# Patient Record
Sex: Female | Born: 1970 | Race: Black or African American | Hispanic: No | Marital: Married | Smoking: Never smoker
Health system: Southern US, Community
[De-identification: ages and names within clinical notes are randomized; demographics above are authoritative.]

## PROBLEM LIST (undated history)

## (undated) ENCOUNTER — Inpatient Hospital Stay (HOSPITAL_COMMUNITY): Payer: Self-pay

## (undated) DIAGNOSIS — D259 Leiomyoma of uterus, unspecified: Secondary | ICD-10-CM

## (undated) HISTORY — PX: NASAL POLYP EXCISION: SHX2068

## (undated) HISTORY — PX: WISDOM TOOTH EXTRACTION: SHX21

---

## 2006-01-18 ENCOUNTER — Encounter: Admission: RE | Admit: 2006-01-18 | Discharge: 2006-04-18 | Payer: Self-pay | Admitting: General Practice

## 2006-01-19 ENCOUNTER — Encounter: Admission: RE | Admit: 2006-01-19 | Discharge: 2006-01-19 | Payer: Self-pay | Admitting: General Practice

## 2007-04-05 ENCOUNTER — Ambulatory Visit (HOSPITAL_COMMUNITY): Admission: RE | Admit: 2007-04-05 | Discharge: 2007-04-05 | Payer: Self-pay | Admitting: Obstetrics

## 2007-05-14 ENCOUNTER — Inpatient Hospital Stay (HOSPITAL_COMMUNITY): Admission: AD | Admit: 2007-05-14 | Discharge: 2007-05-16 | Payer: Self-pay | Admitting: Obstetrics & Gynecology

## 2007-06-25 ENCOUNTER — Inpatient Hospital Stay (HOSPITAL_COMMUNITY): Admission: AD | Admit: 2007-06-25 | Discharge: 2007-06-26 | Payer: Self-pay | Admitting: Obstetrics

## 2007-10-09 ENCOUNTER — Inpatient Hospital Stay (HOSPITAL_COMMUNITY): Admission: AD | Admit: 2007-10-09 | Discharge: 2007-10-09 | Payer: Self-pay | Admitting: Obstetrics

## 2007-11-13 ENCOUNTER — Inpatient Hospital Stay (HOSPITAL_COMMUNITY): Admission: AD | Admit: 2007-11-13 | Discharge: 2007-11-13 | Payer: Self-pay | Admitting: Obstetrics

## 2007-11-15 ENCOUNTER — Inpatient Hospital Stay (HOSPITAL_COMMUNITY): Admission: AD | Admit: 2007-11-15 | Discharge: 2007-11-18 | Payer: Self-pay | Admitting: Obstetrics

## 2007-11-15 ENCOUNTER — Ambulatory Visit: Payer: Self-pay | Admitting: Obstetrics & Gynecology

## 2007-12-07 ENCOUNTER — Ambulatory Visit (HOSPITAL_COMMUNITY): Admission: RE | Admit: 2007-12-07 | Discharge: 2007-12-07 | Payer: Self-pay | Admitting: Obstetrics & Gynecology

## 2009-12-31 ENCOUNTER — Emergency Department (HOSPITAL_COMMUNITY): Admission: EM | Admit: 2009-12-31 | Discharge: 2009-12-31 | Payer: Self-pay | Admitting: Emergency Medicine

## 2010-05-17 ENCOUNTER — Ambulatory Visit (HOSPITAL_COMMUNITY): Admission: RE | Admit: 2010-05-17 | Discharge: 2010-05-17 | Payer: Self-pay | Admitting: Obstetrics & Gynecology

## 2010-08-17 ENCOUNTER — Inpatient Hospital Stay (HOSPITAL_COMMUNITY)
Admission: AD | Admit: 2010-08-17 | Discharge: 2010-08-17 | Payer: Self-pay | Source: Home / Self Care | Admitting: Obstetrics & Gynecology

## 2010-10-10 ENCOUNTER — Inpatient Hospital Stay (HOSPITAL_COMMUNITY)
Admission: AD | Admit: 2010-10-10 | Discharge: 2010-10-10 | Payer: Self-pay | Source: Home / Self Care | Attending: Obstetrics & Gynecology | Admitting: Obstetrics & Gynecology

## 2010-10-11 ENCOUNTER — Inpatient Hospital Stay (HOSPITAL_COMMUNITY)
Admission: AD | Admit: 2010-10-11 | Discharge: 2010-10-13 | Payer: Self-pay | Source: Home / Self Care | Attending: Obstetrics & Gynecology | Admitting: Obstetrics & Gynecology

## 2010-10-13 LAB — CBC
HCT: 37.6 % (ref 36.0–46.0)
HCT: 32.8 % — ABNORMAL LOW (ref 36.0–46.0)
Hemoglobin: 12.9 g/dL (ref 12.0–15.0)
Hemoglobin: 11 g/dL — ABNORMAL LOW (ref 12.0–15.0)
MCH: 30.7 pg (ref 26.0–34.0)
MCH: 30.1 pg (ref 26.0–34.0)
MCHC: 34.3 g/dL (ref 30.0–36.0)
MCHC: 33.5 g/dL (ref 30.0–36.0)
MCV: 89.5 fL (ref 78.0–100.0)
MCV: 89.6 fL (ref 78.0–100.0)
Platelets: 119 10*3/uL — ABNORMAL LOW (ref 150–400)
Platelets: 135 10*3/uL — ABNORMAL LOW (ref 150–400)
RBC: 4.2 MIL/uL (ref 3.87–5.11)
RBC: 3.66 MIL/uL — ABNORMAL LOW (ref 3.87–5.11)
RDW: 14.6 % (ref 11.5–15.5)
RDW: 14.6 % (ref 11.5–15.5)
WBC: 6.4 10*3/uL (ref 4.0–10.5)
WBC: 7.7 10*3/uL (ref 4.0–10.5)

## 2010-10-13 LAB — RPR: RPR Ser Ql: NONREACTIVE

## 2010-10-17 ENCOUNTER — Encounter: Payer: Self-pay | Admitting: Obstetrics & Gynecology

## 2011-02-08 NOTE — Op Note (Signed)
NAMELETESHA, Amy Santana         ACCOUNT NO.:  [id number]   MEDICAL RECORD NO.:  [id number]          PATIENT TYPE:  AMB   LOCATION:  SDC                           FACILITY:  [facility]   PHYSICIAN:  Roseanna Rainbow, M.D.DATE OF BIRTH:  07/30/1971   DATE OF PROCEDURE:  12/07/2007  DATE OF DISCHARGE:                               OPERATIVE REPORT   PREOPERATIVE DIAGNOSIS:  Left-sided Bartholin's gland cyst.   POSTOPERATIVE DIAGNOSIS:  Left-sided Bartholin's gland abscess,  hematoma.   PROCEDURE:  Marsupialization of the left Bartholin's gland.   SURGEON:  Jackson-Moore.   ANESTHESIA:  Managed anesthesia care, local.   FINDINGS:  Enlarged left Bartholin's gland with purulent material admix  with old blood and clot as the contents of the gland.   ESTIMATED BLOOD LOSS:  Minimal.   COMPLICATIONS:  None.   IV FLUIDS AND URINE OUTPUT:  As per anesthesiology.   PROCEDURE:  The patient was taken to the operating room with an IV  running.  She was placed in the dorsal lithotomy position and prepped  and draped in the usual sterile fashion.  An exam revealed the above  findings.  The Bartholin's gland was enlarged to approximately 2-4 cm in  diameter.  Labial stitches were then placed to facilitate exposure.  The  mucocutaneous junction on the left side was then infiltrated with 1%  Xylocaine.  An elliptical incision was made over the Bartholin's gland  with a scalpel.  This skin was then excised.  The gland was then opened  with the scalpel.  This incision was then extended with scissors.  The  contents of the gland were then cultured.  The gland was then irrigated.  The bleeding points in the cyst wall base were cauterized with the  Bovie.  The cyst wall was then plicated to the vaginal mucosa using  interrupted sutures of 2-0 Vicryl.  The sutures were placed  circumferentially.  The gland was then irrigated.  Adequate hemostasis  was noted.  The gland was then packed with Iodoform  gauze.  The  retraction sutures in the labia were then cut.  At the close of the  procedure, the instrument and pack counts were said to be correct x2.  The patient was taken to the PACU awake and in stable condition.      Roseanna Rainbow, M.D.  Electronically Signed    LAJ/MEDQ  D:  12/07/2007  T:  12/09/2007  Job:  [id number]

## 2011-02-08 NOTE — H&P (Signed)
Amy Santana, Amy Santana         ACCOUNT NO.:  [id number]   MEDICAL RECORD NO.:  [id number]          PATIENT TYPE:  OBV   LOCATION:  9315                          FACILITY:  [facility]   PHYSICIAN:  Roseanna Rainbow, M.D.DATE OF BIRTH:  09-28-1970   DATE OF ADMISSION:  05/14/2007  DATE OF DISCHARGE:                              HISTORY & PHYSICAL   CHIEF COMPLAINT:  The patient is a 40 year old, para 0, with an  estimated date of confinement of November 17, 2007, with an intrauterine  pregnancy at 13 plus weeks complaining of lower abdominal pain.   HISTORY OF PRESENT ILLNESS:  The patient has multiple subserosal uterine  fibroids.  There is an anterior fibroid, a left fibroid as well as a  right fundal fibroid.  These are approximately 2-5 cm in size.  The  patient reports onset of pain several day prior to presentation.  The  pain is characterized as both sharp and crampy.  It is constant.  She  localizes the pain to her left lower quadrant and near the umbilicus.  She denies any associated symptoms.   ALLERGIES:  No known drug allergies.   MEDICATIONS:  Please see the medication reconciliation form.   OB RISK FACTORS:  Please see the above, advanced maternal age.   PRENATAL SCREENING:  Rubella immune, RPR nonreactive.  Platelets  199,000.  HIV nonreactive.  Hemoglobin 12.1, hematocrit 37.7.  Hepatis B  surface antigen negative.  GC probe negative.  Chlamydia probe negative.  Urine culture and sensitivity:  Insignificant growth.  Blood type B  positive.  Antibody screen negative.   PAST GYN HISTORY:  Noncontributory.   PAST MEDICAL HISTORY:  She denies.   PAST SURGICAL HISTORY:  Nasal polyp removed.   SOCIAL HISTORY:  She is married, living with her spouse.  Does not give  any significant history of alcohol usage, has no significant smoking  history.   FAMILY HISTORY:  No major illnesses known.   PHYSICAL EXAMINATION:  VITAL SIGNS:  Temperature 98,  blood pressure  101/74, pulse 101, respirations 20.  GENERAL:  No apparent distress.  ABDOMEN:  There is tenderness noted anteriorly in the periumbilical  region as well as the left lower quadrant.  There is no rebound or  guarding.  PELVIC:  Exam deferred.   LABORATORY DATA:  LDH normal. CMET normal. CBC:  White blood cell count  7.7, hemoglobin 12.8,  platelets 188,000.  Urinalysis negative.   ASSESSMENT:  Early pregnancy complicated by uterine fibroids, rule out  degeneration.   PLAN:  1. Observation.  2. IV hydration.  3. Indomethacin.      Roseanna Rainbow, M.D.  Electronically Signed     LAJ/MEDQ  D:  05/14/2007  T:  05/14/2007  Job:  [id number]

## 2011-05-23 ENCOUNTER — Inpatient Hospital Stay (INDEPENDENT_AMBULATORY_CARE_PROVIDER_SITE_OTHER)
Admission: RE | Admit: 2011-05-23 | Discharge: 2011-05-23 | Disposition: A | Discharge status: Home / Self Care | Payer: Medicaid Other | Source: Ambulatory Visit | Attending: Emergency Medicine | Admitting: Emergency Medicine

## 2011-05-23 DIAGNOSIS — J3489 Other specified disorders of nose and nasal sinuses: Secondary | ICD-10-CM

## 2011-05-23 DIAGNOSIS — J310 Chronic rhinitis: Secondary | ICD-10-CM

## 2011-05-23 DIAGNOSIS — J029 Acute pharyngitis, unspecified: Secondary | ICD-10-CM

## 2011-06-16 LAB — URINALYSIS, ROUTINE W REFLEX MICROSCOPIC
Bilirubin Urine: NEGATIVE
Glucose, UA: NEGATIVE
Hgb urine dipstick: NEGATIVE
Ketones, ur: NEGATIVE
Nitrite: NEGATIVE
Protein, ur: NEGATIVE
Specific Gravity, Urine: 1.01
Urobilinogen, UA: 0.2
pH: 7.5

## 2011-06-17 ENCOUNTER — Other Ambulatory Visit (INDEPENDENT_AMBULATORY_CARE_PROVIDER_SITE_OTHER): Payer: Self-pay | Admitting: Otolaryngology

## 2011-06-17 LAB — CBC
HCT: 32 — ABNORMAL LOW
HCT: 37.4
Hemoglobin: 11.1 — ABNORMAL LOW
Hemoglobin: 13.1
MCHC: 34.8
MCHC: 35.1
MCV: 89.8
MCV: 90.1
Platelets: 151
Platelets: 164
RBC: 3.56 — ABNORMAL LOW
RBC: 4.17
RDW: 14.1
RDW: 14.7
WBC: 11.3 — ABNORMAL HIGH
WBC: 7.1

## 2011-06-17 LAB — RPR: RPR Ser Ql: NONREACTIVE

## 2011-06-20 LAB — WOUND CULTURE: Culture: NO GROWTH

## 2011-06-20 LAB — CBC
HCT: 36.2
Hemoglobin: 12.4
MCHC: 34.4
MCV: 88.3
Platelets: 272
RBC: 4.09
RDW: 13.6
WBC: 7.4

## 2011-06-20 LAB — ANAEROBIC CULTURE

## 2011-06-24 ENCOUNTER — Ambulatory Visit
Admission: RE | Admit: 2011-06-24 | Discharge: 2011-06-24 | Disposition: A | Discharge status: Home / Self Care | Payer: Medicaid Other | Source: Ambulatory Visit | Attending: Otolaryngology | Admitting: Otolaryngology

## 2011-07-07 LAB — URINALYSIS, ROUTINE W REFLEX MICROSCOPIC
Bilirubin Urine: NEGATIVE
Glucose, UA: NEGATIVE
Hgb urine dipstick: NEGATIVE
Ketones, ur: NEGATIVE
Nitrite: NEGATIVE
Protein, ur: NEGATIVE
Specific Gravity, Urine: 1.01
Urobilinogen, UA: 0.2
pH: 7.5

## 2011-07-08 LAB — COMPREHENSIVE METABOLIC PANEL
ALT: 22
AST: 25
Albumin: 3 — ABNORMAL LOW
Alkaline Phosphatase: 61
BUN: 2 — ABNORMAL LOW
CO2: 25
Calcium: 9.2
Chloride: 105
Creatinine, Ser: 0.56
GFR calc Af Amer: >60
GFR calc non Af Amer: >60
Glucose, Bld: 74
Potassium: 3.5
Sodium: 136
Total Bilirubin: 0.4
Total Protein: 6.8

## 2011-07-08 LAB — URINALYSIS, ROUTINE W REFLEX MICROSCOPIC
Bilirubin Urine: NEGATIVE
Glucose, UA: NEGATIVE
Hgb urine dipstick: NEGATIVE
Ketones, ur: NEGATIVE
Nitrite: NEGATIVE
Protein, ur: NEGATIVE
Specific Gravity, Urine: <1.005 — ABNORMAL LOW
Urobilinogen, UA: 0.2
pH: 5.5

## 2011-07-08 LAB — CBC
HCT: 35.9 — ABNORMAL LOW
Hemoglobin: 12.8
MCHC: 35.6
MCV: 85.5
Platelets: 188
RBC: 4.2
RDW: 16.4 — ABNORMAL HIGH
WBC: 7.7

## 2011-07-08 LAB — LACTATE DEHYDROGENASE: LDH: 146

## 2011-07-20 ENCOUNTER — Encounter (HOSPITAL_COMMUNITY): Payer: Self-pay | Admitting: *Deleted

## 2011-07-20 ENCOUNTER — Inpatient Hospital Stay (HOSPITAL_COMMUNITY)
Admission: AD | Admit: 2011-07-20 | Discharge: 2011-07-20 | Disposition: A | Discharge status: Home / Self Care | Payer: Medicaid Other | Source: Ambulatory Visit | Attending: Obstetrics & Gynecology | Admitting: Obstetrics & Gynecology

## 2011-07-20 DIAGNOSIS — N644 Mastodynia: Secondary | ICD-10-CM

## 2011-07-20 NOTE — ED Provider Notes (Signed)
History   Pt presents today c/o pain in her Rt breast. She states her child is 73mo old and recently bit her breast. Since that time she developed a small "ulcer" on her nipple and states it is painful to breastfeed. 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 Senaida Ores tomorrow. She denies fever, redness, or any other sx. She continues to breastfeed without difficulty.  Chief Complaint  Patient presents with  . Breast Pain   HPI  OB History    Grav Para Term Preterm Abortions TAB SAB Ect Mult Living   2 2 2  0 0 0 0 0 2       Past Medical History  Diagnosis Date  . No pertinent past medical history     Past Surgical History  Procedure Date  . Nasal polyp excision     Family History  Problem Relation Age of Onset  . Hypertension Mother     History  Substance Use Topics  . Smoking status: Never Smoker   . Smokeless tobacco: Not on file  . Alcohol Use: No    Allergies: Allergies not on file  No prescriptions prior to admission    Review of Systems  Constitutional: Negative for fever and chills.  Eyes: Negative for blurred vision.  Cardiovascular: Negative for chest pain.  Gastrointestinal: Negative for nausea, vomiting, abdominal pain, diarrhea and constipation.  Genitourinary: Negative for dysuria, urgency, frequency and hematuria.  Skin: Negative for rash.  Neurological: Negative for dizziness and headaches.  Psychiatric/Behavioral: Negative for depression and suicidal ideas.   Physical Exam   Blood pressure 118/84, pulse 77, temperature 98.9 F (37.2 C), resp. rate 16, height 5\' 1"  (1.549 m), weight 148 lb (67.132 kg), last menstrual period 06/20/2011, SpO2 99.00%.  Physical Exam  Nursing note and vitals reviewed. Constitutional: She is oriented to person, place, and time. She appears well-developed and well-nourished. No distress.  HENT:  Head: Normocephalic and atraumatic.  Eyes: EOM are normal. Pupils are equal, round, and reactive to  light.  GI: Soft. She exhibits no distension. There is no tenderness. There is no rebound and no guarding.  Genitourinary:       Breasts are symmetrical. She has a small 0.5cm superficial cyst just superior to the Rt areola. It is easily mobile and slightly tender to palpation. There is no obvious lesion to her nipple. No erythema, edema, or purulent drainage.  Neurological: She is alert and oriented to person, place, and time.  Skin: Skin is warm and dry. She is not diaphoretic.  Psychiatric: She has a normal mood and affect. Her behavior is normal. Judgment and thought content normal.    MAU Course  Procedures    Assessment and Plan  Mastodynia: discussed with pt at length. She most likely has a blocked milk gland. Advised warm compresses and told her to keep her scheduled appt tomorrow. Discussed diet, activity, risks, and precautions.  Clinton Gallant. Rice III, DrHSc, MPAS, PA-C  07/20/2011, 7:54 PM   Estelle Hoover, PA 07/20/11 2001

## 2011-07-20 NOTE — Progress Notes (Signed)
Pt reports she is breastfeeding her 28 month old son and has noticed a reddened area on her rt breast, also noted a blister like area on nipple. States it is painful to breastfeed. Denies fever.

## 2011-07-30 ENCOUNTER — Ambulatory Visit (INDEPENDENT_AMBULATORY_CARE_PROVIDER_SITE_OTHER): Payer: Medicaid Other

## 2011-07-30 ENCOUNTER — Inpatient Hospital Stay (INDEPENDENT_AMBULATORY_CARE_PROVIDER_SITE_OTHER)
Admission: RE | Admit: 2011-07-30 | Discharge: 2011-07-30 | Disposition: A | Discharge status: Home / Self Care | Payer: Medicaid Other | Source: Ambulatory Visit | Attending: Family Medicine | Admitting: Family Medicine

## 2011-07-30 DIAGNOSIS — B9789 Other viral agents as the cause of diseases classified elsewhere: Secondary | ICD-10-CM

## 2012-03-27 ENCOUNTER — Encounter (HOSPITAL_COMMUNITY): Payer: Self-pay | Admitting: Emergency Medicine

## 2012-03-27 ENCOUNTER — Emergency Department (INDEPENDENT_AMBULATORY_CARE_PROVIDER_SITE_OTHER)
Admission: EM | Admit: 2012-03-27 | Discharge: 2012-03-27 | Disposition: A | Discharge status: Home / Self Care | Payer: Medicaid Other | Source: Home / Self Care | Attending: Emergency Medicine | Admitting: Emergency Medicine

## 2012-03-27 DIAGNOSIS — E876 Hypokalemia: Secondary | ICD-10-CM

## 2012-03-27 DIAGNOSIS — I951 Orthostatic hypotension: Secondary | ICD-10-CM

## 2012-03-27 LAB — POCT PREGNANCY, URINE: Preg Test, Ur: NEGATIVE

## 2012-03-27 LAB — COMPREHENSIVE METABOLIC PANEL
ALT: 19 U/L (ref 0–35)
AST: 23 U/L (ref 0–37)
Alkaline Phosphatase: 64 U/L (ref 39–117)
CO2: 25 mEq/L (ref 19–32)
Calcium: 8.9 mg/dL (ref 8.4–10.5)
Chloride: 104 mEq/L (ref 96–112)
GFR calc Af Amer: >90 mL/min (ref >90)
GFR calc non Af Amer: >90 mL/min (ref >90)
Glucose, Bld: 87 mg/dL (ref 70–99)
Potassium: 3.3 mEq/L — ABNORMAL LOW (ref 3.5–5.1)
Sodium: 136 mEq/L (ref 135–145)
Total Bilirubin: 0.2 mg/dL — ABNORMAL LOW (ref 0.3–1.2)

## 2012-03-27 LAB — CBC WITH DIFFERENTIAL/PLATELET
Basophils Absolute: 0 10*3/uL (ref 0.0–0.1)
Eosinophils Relative: 2 % (ref 0–5)
Lymphocytes Relative: 31 % (ref 12–46)
Lymphs Abs: 1.4 10*3/uL (ref 0.7–4.0)
MCV: 87.6 fL (ref 78.0–100.0)
Neutro Abs: 2.8 10*3/uL (ref 1.7–7.7)
Platelets: 151 10*3/uL (ref 150–400)
RBC: 4.21 MIL/uL (ref 3.87–5.11)
RDW: 13.1 % (ref 11.5–15.5)
WBC: 4.6 10*3/uL (ref 4.0–10.5)

## 2012-03-27 LAB — POCT URINALYSIS DIP (DEVICE)
Bilirubin Urine: NEGATIVE
Leukocytes, UA: NEGATIVE
Nitrite: NEGATIVE
Protein, ur: NEGATIVE mg/dL
Urobilinogen, UA: 0.2 mg/dL (ref 0.0–1.0)
pH: 5.5 (ref 5.0–8.0)

## 2012-03-27 MED ORDER — POTASSIUM CHLORIDE CRYS ER 20 MEQ PO TBCR
EXTENDED_RELEASE_TABLET | ORAL | Status: AC
  Filled 2012-03-27: qty 1

## 2012-03-27 MED ORDER — POTASSIUM CHLORIDE ER 10 MEQ PO TBCR: 10.0000 meq | EXTENDED_RELEASE_TABLET | Freq: Two times a day (BID) | ORAL | Status: DC

## 2012-03-27 MED ORDER — POTASSIUM CHLORIDE CRYS ER 20 MEQ PO TBCR
20.0000 meq | EXTENDED_RELEASE_TABLET | Freq: Once | ORAL | Status: AC
  Administered 2012-03-27: 20 meq via ORAL

## 2012-03-27 NOTE — ED Notes (Signed)
Patient sipping on ginger ale

## 2012-03-27 NOTE — ED Notes (Signed)
Reports with last period she lost a lot of blood.  Patient also reports she feels like she did with her pregnancies.

## 2012-03-27 NOTE — ED Notes (Signed)
Patient getting urine specimen.  Equipment at bedside, instructed to place on gown for physician exam

## 2012-03-27 NOTE — ED Notes (Signed)
C/o feeling dizzy and weak.  Onset last week per patient.  Denies cough, no cold, no diarrhea, no vomiting.  Reports no fever, but shivering in the evenings, reports no appetite.  Reports water/foods do not taste normal-taste "bitter" per patient.

## 2012-03-27 NOTE — ED Provider Notes (Signed)
Chief Complaint  Patient presents with  . Dizziness    History of Present Illness:   Amy Santana is a 41 year old female who has had a one-week history of dizziness. She describes this as a sensation that she's about to faint. She denies any vertigo. The dizziness is worse if she stands up or sits up and better if she lies flat. She's felt somewhat feverish, chilled, had some sweats. She denies any rhinorrhea, or cough. She's had no nausea, vomiting, or diarrhea. She doesn't have much of an appetite and thinks she may have lost weight. She states that she is unable to drink water because it has a bitter taste. She feels weak all over, tired, and rundown. She's had a slight sore throat and nasal congestion. She has had no headache, adenopathy, coughing, wheezing, shortness of breath, chest pain, or palpitations. She denies any abdominal pain or urinary or GYN complaints. She's had no extremity pain, paresthesias, swelling, or skin rash.  Review of Systems:  Other than noted above, the patient denies any of the following symptoms. Systemic:  No fever, chills, sweats, fatigue, myalgias, headache, or anorexia. Eye:  No redness, pain or drainage. ENT:  No earache, nasal congestion, rhinorrhea, sinus pressure, or sore throat. Lungs:  No cough, sputum production, wheezing, shortness of breath.  Cardiovascular:  No chest pain, palpitations, or syncope. GI:  No nausea, vomiting, abdominal pain or diarrhea. GU:  No dysuria, frequency, or hematuria. Skin:  No rash or pruritis.  PMFSH:  Past medical history, family history, social history, meds, and allergies were reviewed.  Physical Exam:   Vital signs:  BP 101/65  Pulse 90  Temp 97.9 F (36.6 C) (Oral)  Resp 16  LMP 03/23/2012 Filed Vitals:   03/27/12 1225 Supine  03/27/12 1228 Sitting  03/27/12 1230 Standing  03/27/12 1231  BP: 120/76 107/66 101/65   Pulse: 63 75 90   Temp:    97.9 F (36.6 C)  TempSrc:    Oral  Resp:    16    General:   Alert, in no distress. Eye:  PERRL, full EOMs.  Lids and conjunctivas were normal. ENT:  TMs and canals were normal, without erythema or inflammation.  Nasal mucosa was clear and uncongested, without drainage.  Mucous membranes were moist.  Pharynx was clear, without exudate or drainage.  There were no oral ulcerations or lesions. Neck:  Supple, no adenopathy, tenderness or mass. Thyroid was normal. Lungs:  No respiratory distress.  Lungs were clear to auscultation, without wheezes, rales or rhonchi.  Breath sounds were clear and equal bilaterally. Heart:  Regular rhythm, without gallops, murmers or rubs. Abdomen:  Soft, flat, and non-tender to palpation.  No hepatosplenomagaly or mass. Skin:  Clear, warm, and dry, without rash or lesions.  Labs:   Results for orders placed during the hospital encounter of 03/27/12  POCT PREGNANCY, URINE      Component Value Range   Preg Test, Ur NEGATIVE  NEGATIVE  CBC WITH DIFFERENTIAL      Component Value Range   WBC 4.6  4.0 - 10.5 K/uL   RBC 4.21  3.87 - 5.11 MIL/uL   Hemoglobin 12.6  12.0 - 15.0 g/dL   HCT 56.2  13.0 - 86.5 %   MCV 87.6  78.0 - 100.0 fL   MCH 29.9  26.0 - 34.0 pg   MCHC 34.1  30.0 - 36.0 g/dL   RDW 78.4  69.6 - 29.5 %   Platelets 151  150 - 400  K/uL   Neutrophils Relative 61  43 - 77 %   Neutro Abs 2.8  1.7 - 7.7 K/uL   Lymphocytes Relative 31  12 - 46 %   Lymphs Abs 1.4  0.7 - 4.0 K/uL   Monocytes Relative 6  3 - 12 %   Monocytes Absolute 0.3  0.1 - 1.0 K/uL   Eosinophils Relative 2  0 - 5 %   Eosinophils Absolute 0.1  0.0 - 0.7 K/uL   Basophils Relative 0  0 - 1 %   Basophils Absolute 0.0  0.0 - 0.1 K/uL  COMPREHENSIVE METABOLIC PANEL      Component Value Range   Sodium 136  135 - 145 mEq/L   Potassium 3.3 (*) 3.5 - 5.1 mEq/L   Chloride 104  96 - 112 mEq/L   CO2 25  19 - 32 mEq/L   Glucose, Bld 87  70 - 99 mg/dL   BUN 10  6 - 23 mg/dL   Creatinine, Ser 1.61  0.50 - 1.10 mg/dL   Calcium 8.9  8.4 - 09.6 mg/dL    Total Protein 7.2  6.0 - 8.3 g/dL   Albumin 3.6  3.5 - 5.2 g/dL   AST 23  0 - 37 U/L   ALT 19  0 - 35 U/L   Alkaline Phosphatase 64  39 - 117 U/L   Total Bilirubin 0.2 (*) 0.3 - 1.2 mg/dL   GFR calc non Af Amer >90  >90 mL/min   GFR calc Af Amer >90  >90 mL/min  POCT URINALYSIS DIP (DEVICE)      Component Value Range   Glucose, UA NEGATIVE  NEGATIVE mg/dL   Bilirubin Urine NEGATIVE  NEGATIVE   Ketones, ur NEGATIVE  NEGATIVE mg/dL   Specific Gravity, Urine 1.020  1.005 - 1.030   Hgb urine dipstick MODERATE (*) NEGATIVE   pH 5.5  5.0 - 8.0   Protein, ur NEGATIVE  NEGATIVE mg/dL   Urobilinogen, UA 0.2  0.0 - 1.0 mg/dL   Nitrite NEGATIVE  NEGATIVE   Leukocytes, UA NEGATIVE  NEGATIVE    Course in Urgent Care Center:  She was given potassium chloride extended release 20 mEq by mouth and tolerated this well.   Assessment:  The primary encounter diagnosis was Postural hypotension. A diagnosis of Hypokalemia was also pertinent to this visit. She appeared to have mild hypokalemia and also to be mildly dehydrated. I encouraged her to try get extra oral fluids and potassium-containing foods. I suggested she return if no better in 3 or 4 days.  Plan:   1.  The following meds were prescribed:   New Prescriptions   POTASSIUM CHLORIDE (K-DUR) 10 MEQ TABLET    Take 1 tablet (10 mEq total) by mouth 2 (two) times daily.   2.  The patient was instructed in symptomatic care and handouts were given. 3.  The patient was told to return if becoming worse in any way, if no better in 3 or 4 days, and given some red flag symptoms that would indicate earlier return.    Reuben Likes, MD 03/27/12 [phone]

## 2012-05-14 ENCOUNTER — Ambulatory Visit: Payer: Self-pay | Admitting: Family Medicine

## 2012-07-20 ENCOUNTER — Encounter (HOSPITAL_COMMUNITY): Payer: Self-pay | Admitting: *Deleted

## 2012-07-20 ENCOUNTER — Emergency Department (HOSPITAL_COMMUNITY)
Admission: EM | Admit: 2012-07-20 | Discharge: 2012-07-20 | Disposition: A | Discharge status: Home / Self Care | Payer: Medicaid Other | Source: Home / Self Care | Attending: Emergency Medicine | Admitting: Emergency Medicine

## 2012-07-20 DIAGNOSIS — L219 Seborrheic dermatitis, unspecified: Secondary | ICD-10-CM

## 2012-07-20 DIAGNOSIS — L218 Other seborrheic dermatitis: Secondary | ICD-10-CM

## 2012-07-20 MED ORDER — KETOCONAZOLE 2 % EX SHAM: MEDICATED_SHAMPOO | CUTANEOUS | Status: DC

## 2012-07-20 NOTE — ED Notes (Signed)
Pt called  In lobby x 1

## 2012-07-20 NOTE — ED Provider Notes (Signed)
History     CSN: [id number]  Arrival date & time 07/20/12  1255   First MD Initiated Contact with Patient 07/20/12 1551      Chief Complaint  Patient presents with  . Rash    (Consider location/radiation/quality/duration/timing/severity/associated sxs/prior treatment) Patient is a 41 y.o. female presenting with rash. The history is provided by the patient.  Rash  This is a recurrent problem. The current episode started more than 1 week ago (6 months ago). Progression since onset: waxing and waning. The problem is associated with nothing. There has been no fever. The rash is present on the scalp. The pain is at a severity of 0/10. The patient is experiencing no pain. Associated symptoms include blisters, itching and weeping. Treatments tried: selsium blue shampoo. The treatment provided mild relief. Risk factors: no known risk factors.    Past Medical History  Diagnosis Date  . No pertinent past medical history     Past Surgical History  Procedure Date  . Nasal polyp excision     Family History  Problem Relation Age of Onset  . Hypertension Mother     History  Substance Use Topics  . Smoking status: Never Smoker   . Smokeless tobacco: Not on file  . Alcohol Use: No    OB History    Grav Para Term Preterm Abortions TAB SAB Ect Mult Living   2 2 2  0 0 0 0 0 2       Review of Systems  Constitutional: Negative.   HENT: Negative.   Respiratory: Negative.   Cardiovascular: Negative.   Skin: Positive for itching and rash.    Allergies  Review of patient's allergies indicates no known allergies.  Home Medications   Current Outpatient Rx  Name Route Sig Dispense Refill  . KETOCONAZOLE 2 % EX SHAM Topical Apply topically 2 (two) times a week. 120 mL 11  . POTASSIUM CHLORIDE ER 10 MEQ PO TBCR Oral Take 1 tablet (10 mEq total) by mouth 2 (two) times daily. 10 tablet 0    BP 110/72  Pulse 71  Temp 98.8 F (37.1 C) (Oral)  Resp 20  SpO2 100%  LMP  06/22/2012  Physical Exam  Nursing note and vitals reviewed. Constitutional: She is oriented to person, place, and time. Vital signs are normal. She appears well-developed and well-nourished. She is active and cooperative.  HENT:  Head: Normocephalic.       Scalp rash-white flaky, seborrhea  Eyes: Conjunctivae normal are normal. Pupils are equal, round, and reactive to light. No scleral icterus.  Neck: Trachea normal. Neck supple.  Cardiovascular: Normal rate and regular rhythm.   Pulmonary/Chest: Effort normal and breath sounds normal.  Neurological: She is alert and oriented to person, place, and time. No cranial nerve deficit or sensory deficit.  Skin: Skin is warm and dry.  Psychiatric: She has a normal mood and affect. Her speech is normal and behavior is normal. Judgment and thought content normal. Cognition and memory are normal.    ED Course  Procedures (including critical care time)  Labs Reviewed - No data to display No results found.   1. Seborrheic dermatitis of scalp       MDM  First wash selsium blue, 2nd wash ketoconozole, 3rd wash shampoo of your choice.  Use hair oil instead of pomades. F/u as needed.       Johnsie Kindred, NP 07/22/12 1843

## 2012-07-20 NOTE — ED Notes (Signed)
Pt reports rash and sores in scalp for the past 6 months

## 2012-07-23 NOTE — ED Provider Notes (Signed)
Medical screening examination/treatment/procedure(s) were performed by non-physician practitioner and as supervising physician I was immediately available for consultation/collaboration.  Leslee Home, M.D.   Reuben Likes, MD 07/23/12 [phone]

## 2012-08-10 ENCOUNTER — Encounter (HOSPITAL_COMMUNITY): Payer: Self-pay | Admitting: *Deleted

## 2012-08-10 ENCOUNTER — Emergency Department (INDEPENDENT_AMBULATORY_CARE_PROVIDER_SITE_OTHER)
Admission: EM | Admit: 2012-08-10 | Discharge: 2012-08-10 | Disposition: A | Discharge status: Home / Self Care | Payer: Medicaid Other | Source: Home / Self Care | Attending: Emergency Medicine | Admitting: Emergency Medicine

## 2012-08-10 DIAGNOSIS — S43499A Other sprain of unspecified shoulder joint, initial encounter: Secondary | ICD-10-CM

## 2012-08-10 DIAGNOSIS — S46811A Strain of other muscles, fascia and tendons at shoulder and upper arm level, right arm, initial encounter: Secondary | ICD-10-CM

## 2012-08-10 MED ORDER — CYCLOBENZAPRINE HCL 10 MG PO TABS: ORAL_TABLET | ORAL | Status: DC

## 2012-08-10 NOTE — ED Notes (Signed)
Pt continuing to await MD assessment

## 2012-08-10 NOTE — ED Provider Notes (Signed)
Medical screening examination/treatment/procedure(s) were performed by non-physician practitioner and as supervising physician I was immediately available for consultation/collaboration.  Kalyse Meharg, M.D.   Haward Pope C Sybol Morre, MD 08/10/12 2111 

## 2012-08-10 NOTE — ED Provider Notes (Signed)
History     CSN: [id number]  Arrival date & time 08/10/12  1226   First MD Initiated Contact with Patient 08/10/12 1359      Chief Complaint  Patient presents with  . Torticollis    (Consider location/radiation/quality/duration/timing/severity/associated sxs/prior treatment) HPI Comments: Pt states she fell asleep while sitting on her couch ~ 1 week ago.  Starting yesterday she has had pain in the R trapezius distribution.   Hurts to rotate neck.  No known injury.  No fever or chills.  No PCP  The history is provided by the patient. No language interpreter was used.    Past Medical History  Diagnosis Date  . No pertinent past medical history     Past Surgical History  Procedure Date  . Nasal polyp excision     Family History  Problem Relation Age of Onset  . Hypertension Mother     History  Substance Use Topics  . Smoking status: Never Smoker   . Smokeless tobacco: Not on file  . Alcohol Use: No    OB History    Grav Para Term Preterm Abortions TAB SAB Ect Mult Living   2 2 2  0 0 0 0 0 2       Review of Systems  Constitutional: Negative for fever and chills.  HENT: Negative for neck pain.   Neurological: Negative for weakness and numbness.  All other systems reviewed and are negative.    Allergies  Review of patient's allergies indicates no known allergies.  Home Medications   Current Outpatient Rx  Name  Route  Sig  Dispense  Refill  . CYCLOBENZAPRINE HCL 10 MG PO TABS      1/2 to one po TID   20 tablet   0   . KETOCONAZOLE 2 % EX SHAM   Topical   Apply topically 2 (two) times a week.   120 mL   11   . POTASSIUM CHLORIDE ER 10 MEQ PO TBCR   Oral   Take 1 tablet (10 mEq total) by mouth 2 (two) times daily.   10 tablet   0     BP 118/89  Pulse 71  Temp 98.6 F (37 C) (Oral)  Resp 16  SpO2 99%  LMP 07/22/2012  Physical Exam  Nursing note and vitals reviewed. Constitutional: She is oriented to person, place, and time. She  appears well-developed and well-nourished. No distress.  HENT:  Head: Normocephalic and atraumatic.  Eyes: EOM are normal.  Neck: Normal range of motion.  Cardiovascular: Normal rate and regular rhythm.   Pulmonary/Chest: Effort normal.  Abdominal: Soft. She exhibits no distension. There is no tenderness.  Musculoskeletal:       Thoracic back: She exhibits decreased range of motion, tenderness and pain. She exhibits no deformity.       Back:  Neurological: She is alert and oriented to person, place, and time.  Skin: Skin is warm and dry.  Psychiatric: She has a normal mood and affect. Judgment normal.    ED Course  Procedures (including critical care time)  Labs Reviewed - No data to display No results found.   1. Strain of right trapezius muscle       MDM  rx-flexeril, 20 Ibuprofen Ice Return prn         Evalina Field, [place] 08/10/12 1504

## 2012-08-10 NOTE — ED Notes (Signed)
Pt provided with blankets, room darkened for comfort and temp rechecked of 98.6

## 2012-09-26 NOTE — L&D Delivery Note (Signed)
Delivery Note At 12:13 PM a viable female was deliveVertexred via  (Presentation: Vertex ;  ).  APGAR: 9, 9; weight: 3760 gms .   Placenta status: Intact , .  Cord: 3 vessel with the following complications: None.  Cord pH: none  Anesthesia:  None Episiotomy:  None Lacerations: None Suture Repair: none Est. Blood Loss (mL): 350  Mom to postpartum.  Baby to nursery-stable.  HARPER,CHARLES A 07/09/2013, 12:31 PM

## 2012-11-18 ENCOUNTER — Encounter (HOSPITAL_COMMUNITY): Payer: Self-pay | Admitting: Family

## 2012-11-18 ENCOUNTER — Inpatient Hospital Stay (HOSPITAL_COMMUNITY): Payer: Medicaid Other

## 2012-11-18 ENCOUNTER — Emergency Department (HOSPITAL_COMMUNITY): Payer: Medicaid Other

## 2012-11-18 ENCOUNTER — Emergency Department (HOSPITAL_COMMUNITY)
Admission: EM | Admit: 2012-11-18 | Discharge: 2012-11-18 | Disposition: A | Discharge status: Home / Self Care | Payer: Medicaid Other | Source: Home / Self Care

## 2012-11-18 ENCOUNTER — Encounter (HOSPITAL_COMMUNITY): Payer: Self-pay | Admitting: *Deleted

## 2012-11-18 ENCOUNTER — Inpatient Hospital Stay (HOSPITAL_COMMUNITY)
Admission: AD | Admit: 2012-11-18 | Discharge: 2012-11-18 | Disposition: A | Discharge status: Home / Self Care | Payer: Medicaid Other | Source: Ambulatory Visit | Attending: Obstetrics and Gynecology | Admitting: Obstetrics and Gynecology

## 2012-11-18 DIAGNOSIS — O99891 Other specified diseases and conditions complicating pregnancy: Secondary | ICD-10-CM | POA: Insufficient documentation

## 2012-11-18 DIAGNOSIS — R1032 Left lower quadrant pain: Secondary | ICD-10-CM | POA: Insufficient documentation

## 2012-11-18 HISTORY — DX: Leiomyoma of uterus, unspecified: D25.9

## 2012-11-18 LAB — CBC
MCH: 28.5 pg (ref 26.0–34.0)
MCHC: 33.4 g/dL (ref 30.0–36.0)
MCV: 85.3 fL (ref 78.0–100.0)
Platelets: 173 10*3/uL (ref 150–400)
RBC: 4 MIL/uL (ref 3.87–5.11)

## 2012-11-18 LAB — HCG, QUANTITATIVE, PREGNANCY: hCG, Beta Chain, Quant, S: 30401 m[IU]/mL — ABNORMAL HIGH (ref <5)

## 2012-11-18 NOTE — MAU Note (Addendum)
Patient presents to MAU from [facility] with c/o LLQ pain, relieved by eating. Pain persists since Monday.  Patient had been fasting for two weeks from 0600 to 1200 daily and stopped on 2/7. Reports she has eaten a regular diet since that time.  Denies vaginal bleeding; patient was not aware of pregnancy until verified by Urgent Care today.

## 2012-11-18 NOTE — MAU Provider Note (Signed)
Attestation of Attending Supervision of Advanced Practitioner: Evaluation and management procedures were performed by the PA/NP/CNM/OB Fellow under my supervision/collaboration. Chart reviewed and agree with management and plan.  Mayzee Reichenbach V 11/18/2012 10:25 PM

## 2012-11-18 NOTE — ED Provider Notes (Signed)
History     CSN: [id number]  Arrival date & time 11/18/12  1143   First MD Initiated Contact with Patient 11/18/12 1151      Chief Complaint  Patient presents with  . Nausea    (Consider location/radiation/quality/duration/timing/severity/associated sxs/prior treatment) HPI Is a 42 year old female who presents with abdominal pain for 5 days. Is present in the lower part of her abdomen and she states that initially it was crampy but now its a dull ache which comes and goes. Interestingly eating usually makes the pain better. She has been taking ibuprofen 800 mg daily to help alleviate the pain. She has some nausea and one episode of vomiting today. She has no complaints of constipation or diarrhea. Her last menstrual period was in mid January. She has no vaginal discharge or bleeding. A complaints of dysuria or hematuria. No Flank pain  Past Medical History  Diagnosis Date  . No pertinent past medical history     Past Surgical History  Procedure Laterality Date  . Nasal polyp excision      Family History  Problem Relation Age of Onset  . Hypertension Mother     History  Substance Use Topics  . Smoking status: Never Smoker   . Smokeless tobacco: Not on file  . Alcohol Use: No    OB History   Grav Para Term Preterm Abortions TAB SAB Ect Mult Living   2 2 2  0 0 0 0 0 2       Review of Systems  Constitutional: Positive for chills, appetite change and fatigue. Negative for fever.  HENT: Negative.   Respiratory: Negative.   Cardiovascular: Negative.   Gastrointestinal: Positive for nausea, vomiting and abdominal pain. Negative for diarrhea, constipation, blood in stool, abdominal distention, anal bleeding and rectal pain.  Genitourinary: Negative.   Musculoskeletal: Negative.   Skin: Negative.   Neurological: Negative.   Psychiatric/Behavioral: Negative.     Allergies  Review of patient's allergies indicates no known allergies.  Home Medications   Current  Outpatient Rx  Name  Route  Sig  Dispense  Refill  . cyclobenzaprine (FLEXERIL) 10 MG tablet      1/2 to one po TID   20 tablet   0   . ketoconazole (NIZORAL) 2 % shampoo   Topical   Apply topically 2 (two) times a week.   120 mL   11   . potassium chloride (K-DUR) 10 MEQ tablet   Oral   Take 1 tablet (10 mEq total) by mouth 2 (two) times daily.   10 tablet   0     BP 100/68  Pulse 85  Temp(Src) 99.2 F (37.3 C) (Oral)  Resp 20  SpO2 100%  LMP 10/22/2012  Physical Exam  ED Course  Procedures (including critical care time)  Labs Reviewed  POCT PREGNANCY, URINE - Abnormal; Notable for the following:    Preg Test, Ur POSITIVE (*)    All other components within normal limits   No results found.   1. Pregnancy   2. Abdominal pain, acute, left lower quadrant       MDM  Been referred to [facility] hospital for a pelvic ultrasound further evaluate pain and pregnancy.        Calvert Cantor, MD 11/18/12 1434

## 2012-11-18 NOTE — MAU Provider Note (Signed)
History     CSN: [id number]  Arrival date and time: 11/18/12 1503   None     No chief complaint on file.  HPI 42 y.o. G3P2000 at [redacted]w[redacted]d with LLQ pain, no vaginal bleeding, + UPT sent from Urgent Care for r/o ectopic. States the pain is worse when she is hungry, gets better when she eats.    Past Medical History  Diagnosis Date  . No pertinent past medical history   . Uterine fibroid     Past Surgical History  Procedure Laterality Date  . Nasal polyp excision    . Wisdom tooth extraction      Family History  Problem Relation Age of Onset  . Hypertension Mother     History  Substance Use Topics  . Smoking status: Never Smoker   . Smokeless tobacco: Never Used  . Alcohol Use: No    Allergies: No Known Allergies  Prescriptions prior to admission  Medication Sig Dispense Refill  . ibuprofen (ADVIL,MOTRIN) 800 MG tablet Take 800 mg by mouth every 8 (eight) hours as needed (pain).        Review of Systems  Constitutional: Negative.   Respiratory: Negative.   Cardiovascular: Negative.   Gastrointestinal: Positive for abdominal pain. Negative for nausea, vomiting, diarrhea and constipation.  Genitourinary: Negative for dysuria, urgency, frequency, hematuria and flank pain.       Negative for vaginal bleeding, vaginal discharge, dyspareunia  Musculoskeletal: Negative.   Neurological: Negative.   Psychiatric/Behavioral: Negative.    Physical Exam   Blood pressure 105/62, pulse 78, temperature 97.9 F (36.6 C), temperature source Oral, resp. rate 18, height 5\' 1"  (1.549 m), weight 138 lb (62.596 kg), last menstrual period 10/22/2012.  Physical Exam  Nursing note and vitals reviewed. Constitutional: She is oriented to person, place, and time. She appears well-developed and well-nourished. No distress.  Cardiovascular: Normal rate.   Respiratory: Effort normal.  GI: Soft. There is no tenderness.  Genitourinary:  Patient declines   Musculoskeletal: Normal range  of motion.  Neurological: She is alert and oriented to person, place, and time.  Skin: Skin is warm and dry.  Psychiatric: She has a normal mood and affect.    MAU Course  Procedures Results for orders placed during the hospital encounter of 11/18/12 (from the past 24 hour(s))  CBC     Status: Abnormal   Collection Time    11/18/12  3:14 PM      Result Value Range   WBC 4.2  4.0 - 10.5 K/uL   RBC 4.00  3.87 - 5.11 MIL/uL   Hemoglobin 11.4 (*) 12.0 - 15.0 g/dL   HCT 14.7 (*) 82.9 - 56.2 %   MCV 85.3  78.0 - 100.0 fL   MCH 28.5  26.0 - 34.0 pg   MCHC 33.4  30.0 - 36.0 g/dL   RDW 13.0  86.5 - 78.4 %   Platelets 173  150 - 400 K/uL  ABO/RH     Status: None   Collection Time    11/18/12  3:14 PM      Result Value Range   ABO/RH(D) B POS    HCG, QUANTITATIVE, PREGNANCY     Status: Abnormal   Collection Time    11/18/12  3:20 PM      Result Value Range   hCG, Beta Chain, Quant, S 30401 (*) <5 mIU/mL   US Ob Comp Less 14 Wks  11/18/2012  *RADIOLOGY REPORT*  Clinical Data: Left lower quadrant pain.  3-week 6 days pregnant by last menstrual period.  OBSTETRIC <14 WK ULTRASOUND  Technique:  Transabdominal ultrasound was performed for evaluation of the gestation as well as the maternal uterus and adnexal regions.  Comparison:  05/17/2010  Intrauterine gestational sac: Visualized/normal in shape. Yolk sac: Visualized Embryo: Visualized Cardiac Activity: Present Heart Rate: 106 and bpm  CRL:  4 mm  6 w  1 d          [place] EDC: 07/13/2013  Maternal uterus/Adnexae: No evidence of subchorionic hemorrhage.  Right ovary is normal.  A left ovarian corpus luteal cyst is identified on image 42.  Calcified uterine fibroids are again identified.  The largest is in the right side of the fundus and measures 3.9 cm.  No significant free fluid.  IMPRESSION:  1.  Intrauterine pregnancy of 6 weeks 1 day with fetal heart rate of 106 beats per minute. 2.  Left ovarian corpus luteal cyst. 3.  Uterine fibroids.    Original Report Authenticated By: Jeronimo Greaves, M.D.     Assessment and Plan  42 y.o. G3P2000 at [redacted]w[redacted]d, normal IUP on u/s today Pregnancy verification letter provided, start care as soon as possible Precautions rev'd  FRAZIER,NATALIE 11/18/2012, 4:24 PM

## 2012-11-18 NOTE — ED Notes (Signed)
Patient complains of stomach pain x 1 week with fever/chills, weakness. Vomiting this morning.

## 2012-12-27 ENCOUNTER — Encounter: Payer: Self-pay | Admitting: Obstetrics

## 2013-01-03 ENCOUNTER — Encounter: Payer: Self-pay | Admitting: Obstetrics

## 2013-01-03 ENCOUNTER — Ambulatory Visit (INDEPENDENT_AMBULATORY_CARE_PROVIDER_SITE_OTHER): Payer: Medicaid Other | Admitting: Obstetrics

## 2013-01-03 VITALS — BP 119/76 | Temp 98.6°F | Wt 156.0 lb

## 2013-01-03 DIAGNOSIS — J339 Nasal polyp, unspecified: Secondary | ICD-10-CM

## 2013-01-03 DIAGNOSIS — O09529 Supervision of elderly multigravida, unspecified trimester: Secondary | ICD-10-CM

## 2013-01-03 DIAGNOSIS — Z3201 Encounter for pregnancy test, result positive: Secondary | ICD-10-CM

## 2013-01-03 DIAGNOSIS — O09521 Supervision of elderly multigravida, first trimester: Secondary | ICD-10-CM

## 2013-01-03 MED ORDER — PREDNISONE (PAK) 10 MG PO TABS: ORAL_TABLET | ORAL | Status: DC

## 2013-01-03 NOTE — Progress Notes (Signed)
Pulse-92  Subjective:    Amy Santana is being seen today for her first obstetrical visit.  This is not a planned pregnancy. She is at [redacted]w[redacted]d gestation. Her obstetrical history is significant for advanced maternal age. Relationship with FOB: spouse, living together. Patient does intend to breast feed. Pregnancy history fully reviewed.  Menstrual History: OB History   Grav Para Term Preterm Abortions TAB SAB Ect Mult Living   3 2 2  0 0 0 0 0 2 2      Menarche age: 21 Patient's last menstrual period was 10/22/2012.   The following portions of the patient's history were reviewed and updated as appropriate: allergies, current medications, past family history, past medical history, past social history, past surgical history and problem list.  Review of Systems Pertinent items are noted in HPI.   Objective:   General appearance: alert and no distress Abdomen: normal findings: soft, non-tender Pelvic: cervix normal in appearance, external genitalia normal, no adnexal masses or tenderness, no cervical motion tenderness, rectovaginal septum normal, uterus normal size, shape, and consistency and vagina normal without discharge    Assessment:   Pregnancy at 12 and 6/7 weeks   Plan:   Initial labs drawn. Prenatal vitamins. Problem list reviewed and updated. AFP3 discussed: requested. Role of ultrasound in pregnancy discussed; fetal survey: requested. Amniocentesis discussed: declined. Follow up in 2 weeks. 50% of 20 min visit spent on counseling and coordination of care.

## 2013-01-04 ENCOUNTER — Encounter: Payer: Self-pay | Admitting: Obstetrics

## 2013-01-04 DIAGNOSIS — J339 Nasal polyp, unspecified: Secondary | ICD-10-CM | POA: Insufficient documentation

## 2013-01-04 LAB — OBSTETRIC PANEL
Basophils Absolute: 0 10*3/uL (ref 0.0–0.1)
Basophils Relative: 0 % (ref 0–1)
HCT: 34.2 % — ABNORMAL LOW (ref 36.0–46.0)
Hepatitis B Surface Ag: NEGATIVE
Lymphocytes Relative: 24 % (ref 12–46)
Lymphs Abs: 1.4 10*3/uL (ref 0.7–4.0)
MCH: 28.7 pg (ref 26.0–34.0)
MCV: 86.1 fL (ref 78.0–100.0)
Monocytes Relative: 7 % (ref 3–12)
RDW: 16.1 % — ABNORMAL HIGH (ref 11.5–15.5)
Rh Type: POSITIVE
WBC: 5.8 10*3/uL (ref 4.0–10.5)

## 2013-01-04 LAB — PAP IG W/ RFLX HPV ASCU

## 2013-01-04 LAB — HIV ANTIBODY (ROUTINE TESTING W REFLEX): HIV: NONREACTIVE

## 2013-01-04 NOTE — Progress Notes (Signed)
Doing well 

## 2013-01-05 LAB — CULTURE, OB URINE: Colony Count: 75000

## 2013-01-07 LAB — VARICELLA ZOSTER ANTIBODY, IGG: Varicella IgG: 42.18 Index (ref <135.00)

## 2013-01-10 ENCOUNTER — Encounter: Payer: Self-pay | Admitting: Obstetrics

## 2013-01-17 ENCOUNTER — Ambulatory Visit (INDEPENDENT_AMBULATORY_CARE_PROVIDER_SITE_OTHER): Payer: Medicaid Other | Admitting: Obstetrics & Gynecology

## 2013-01-17 ENCOUNTER — Encounter: Payer: Self-pay | Admitting: Obstetrics & Gynecology

## 2013-01-17 VITALS — BP 121/75 | Temp 98.8°F | Wt 158.0 lb

## 2013-01-17 DIAGNOSIS — J302 Other seasonal allergic rhinitis: Secondary | ICD-10-CM

## 2013-01-17 DIAGNOSIS — Z3480 Encounter for supervision of other normal pregnancy, unspecified trimester: Secondary | ICD-10-CM

## 2013-01-17 DIAGNOSIS — J309 Allergic rhinitis, unspecified: Secondary | ICD-10-CM

## 2013-01-17 DIAGNOSIS — K219 Gastro-esophageal reflux disease without esophagitis: Secondary | ICD-10-CM

## 2013-01-17 LAB — POCT URINALYSIS DIPSTICK
Bilirubin, UA: NEGATIVE
Blood, UA: NEGATIVE
Glucose, UA: NEGATIVE
Ketones, UA: NEGATIVE
Leukocytes, UA: NEGATIVE
Nitrite, UA: NEGATIVE
pH, UA: 5

## 2013-01-17 MED ORDER — DESLORATADINE 5 MG PO TABS
5.0000 mg | ORAL_TABLET | Freq: Every day | ORAL | Status: DC
Start: 2013-01-17 — End: 2013-04-07

## 2013-01-17 MED ORDER — OMEPRAZOLE 20 MG PO CPDR: 20.0000 mg | DELAYED_RELEASE_CAPSULE | Freq: Every day | ORAL | Status: DC

## 2013-01-17 NOTE — Progress Notes (Signed)
Pulse-96 Pt c/o itching eyes, runny nose, and throat irritation x 1 week.

## 2013-01-17 NOTE — Patient Instructions (Signed)
Seasonal Rhinitis GERD

## 2013-01-23 ENCOUNTER — Encounter: Payer: Self-pay | Admitting: Obstetrics

## 2013-01-24 ENCOUNTER — Encounter: Payer: Medicaid Other | Admitting: Obstetrics & Gynecology

## 2013-01-28 ENCOUNTER — Ambulatory Visit (INDEPENDENT_AMBULATORY_CARE_PROVIDER_SITE_OTHER): Payer: Medicaid Other | Admitting: Obstetrics

## 2013-01-28 VITALS — BP 121/76 | Temp 98.2°F | Wt 158.8 lb

## 2013-01-28 DIAGNOSIS — Z3480 Encounter for supervision of other normal pregnancy, unspecified trimester: Secondary | ICD-10-CM

## 2013-01-28 DIAGNOSIS — J302 Other seasonal allergic rhinitis: Secondary | ICD-10-CM

## 2013-01-28 DIAGNOSIS — Z369 Encounter for antenatal screening, unspecified: Secondary | ICD-10-CM

## 2013-01-28 DIAGNOSIS — J309 Allergic rhinitis, unspecified: Secondary | ICD-10-CM

## 2013-01-28 DIAGNOSIS — Z3482 Encounter for supervision of other normal pregnancy, second trimester: Secondary | ICD-10-CM

## 2013-01-28 LAB — POCT URINALYSIS DIPSTICK
Bilirubin, UA: NEGATIVE
Glucose, UA: NEGATIVE
Nitrite, UA: NEGATIVE
Urobilinogen, UA: NEGATIVE

## 2013-01-28 MED ORDER — LORATADINE 10 MG PO TABS: 10.0000 mg | ORAL_TABLET | Freq: Every day | ORAL | Status: DC

## 2013-01-28 NOTE — Progress Notes (Signed)
Pulse- 98 

## 2013-01-31 ENCOUNTER — Encounter: Payer: Medicaid Other | Admitting: Obstetrics

## 2013-02-06 ENCOUNTER — Encounter: Payer: Medicaid Other | Admitting: Obstetrics & Gynecology

## 2013-02-19 ENCOUNTER — Other Ambulatory Visit: Payer: Medicaid Other

## 2013-02-25 ENCOUNTER — Ambulatory Visit (INDEPENDENT_AMBULATORY_CARE_PROVIDER_SITE_OTHER): Payer: Medicaid Other | Admitting: Obstetrics

## 2013-02-25 ENCOUNTER — Encounter: Payer: Self-pay | Admitting: Obstetrics

## 2013-02-25 VITALS — BP 110/71 | Temp 98.2°F | Wt 164.0 lb

## 2013-02-25 DIAGNOSIS — Z3481 Encounter for supervision of other normal pregnancy, first trimester: Secondary | ICD-10-CM

## 2013-02-25 DIAGNOSIS — Z3480 Encounter for supervision of other normal pregnancy, unspecified trimester: Secondary | ICD-10-CM

## 2013-02-25 DIAGNOSIS — Z3482 Encounter for supervision of other normal pregnancy, second trimester: Secondary | ICD-10-CM

## 2013-02-25 LAB — POCT URINALYSIS DIPSTICK
Bilirubin, UA: NEGATIVE
Glucose, UA: NEGATIVE
Nitrite, UA: NEGATIVE

## 2013-02-25 NOTE — Progress Notes (Signed)
P 101 Patient reports she only has discomfort when she stands to cook at home.

## 2013-02-26 ENCOUNTER — Telehealth: Payer: Self-pay | Admitting: *Deleted

## 2013-02-26 ENCOUNTER — Ambulatory Visit (INDEPENDENT_AMBULATORY_CARE_PROVIDER_SITE_OTHER): Payer: Medicaid Other

## 2013-02-26 DIAGNOSIS — Z369 Encounter for antenatal screening, unspecified: Secondary | ICD-10-CM

## 2013-02-26 DIAGNOSIS — O0990 Supervision of high risk pregnancy, unspecified, unspecified trimester: Secondary | ICD-10-CM

## 2013-02-26 DIAGNOSIS — J310 Chronic rhinitis: Secondary | ICD-10-CM

## 2013-02-26 LAB — US OB DETAIL + 14 WK

## 2013-02-26 MED ORDER — RANITIDINE HCL 150 MG PO TABS: 150.0000 mg | ORAL_TABLET | Freq: Every day | ORAL | Status: DC

## 2013-02-26 NOTE — Telephone Encounter (Signed)
Rx sent to pharmacy. Medications updated

## 2013-02-27 ENCOUNTER — Encounter: Payer: Self-pay | Admitting: Obstetrics

## 2013-02-27 ENCOUNTER — Encounter (HOSPITAL_COMMUNITY): Payer: Self-pay | Admitting: Obstetrics

## 2013-03-06 ENCOUNTER — Other Ambulatory Visit: Payer: Self-pay | Admitting: Obstetrics

## 2013-03-06 DIAGNOSIS — Z369 Encounter for antenatal screening, unspecified: Secondary | ICD-10-CM

## 2013-03-07 ENCOUNTER — Ambulatory Visit (HOSPITAL_COMMUNITY): Payer: Medicaid Other

## 2013-03-22 ENCOUNTER — Ambulatory Visit (HOSPITAL_COMMUNITY)
Admission: RE | Admit: 2013-03-22 | Discharge: 2013-03-22 | Disposition: A | Discharge status: Home / Self Care | Payer: Medicaid Other | Source: Ambulatory Visit | Attending: Obstetrics | Admitting: Obstetrics

## 2013-03-22 VITALS — BP 101/65 | HR 95 | Wt 168.5 lb

## 2013-03-22 DIAGNOSIS — O3410 Maternal care for benign tumor of corpus uteri, unspecified trimester: Secondary | ICD-10-CM | POA: Insufficient documentation

## 2013-03-22 DIAGNOSIS — Z363 Encounter for antenatal screening for malformations: Secondary | ICD-10-CM | POA: Insufficient documentation

## 2013-03-22 DIAGNOSIS — D259 Leiomyoma of uterus, unspecified: Secondary | ICD-10-CM

## 2013-03-22 DIAGNOSIS — Z1389 Encounter for screening for other disorder: Secondary | ICD-10-CM | POA: Insufficient documentation

## 2013-03-22 DIAGNOSIS — O358XX0 Maternal care for other (suspected) fetal abnormality and damage, not applicable or unspecified: Secondary | ICD-10-CM | POA: Insufficient documentation

## 2013-03-22 DIAGNOSIS — Z369 Encounter for antenatal screening, unspecified: Secondary | ICD-10-CM

## 2013-03-22 DIAGNOSIS — O09529 Supervision of elderly multigravida, unspecified trimester: Secondary | ICD-10-CM | POA: Insufficient documentation

## 2013-03-22 NOTE — Progress Notes (Signed)
Genetic Counseling  High-Risk Gestation Note  Appointment Date:  03/22/2013 Referred By: Brock Bad, MD Date of Birth:  October 08, 1970    Pregnancy History: [id number] Estimated Date of Delivery: 07/13/13 Estimated Gestational Age: [redacted]w[redacted]d Attending: Alpha Gula, MD   [name] was seen for genetic counseling because of a maternal age of 42 y.o..     She was counseled regarding maternal age and the association with risk for chromosome conditions due to nondisjunction with aging of the ova.   We reviewed chromosomes, nondisjunction, and the associated 1 in 44 risk for fetal aneuploidy related to a maternal age of 42 y.o. at [redacted]w[redacted]d gestation.  She was counseled that the risk for aneuploidy decreases as gestational age increases, accounting for those pregnancies which spontaneously abort.  We specifically discussed Down syndrome (trisomy 40), trisomies 70 and 51, and sex chromosome aneuploidies (47,XXX and 47,XXY) including the common features and prognoses of each.   We reviewed available screening options including noninvasive prenatal screening (NIPS)/cell free fetal DNA (cffDNA) testing and detailed ultrasound. [name] stated that she previously declined Quad screening in the pregnancy. She was counseled that screening tests are used to modify a patient's a priori risk for aneuploidy, typically based on age. This estimate provides a pregnancy specific risk assessment. We reviewed the benefits and limitations of each option. Specifically, we discussed the conditions for which each test screens, the detection rates, and false positive rates of each. She was also counseled regarding diagnostic testing via amniocentesis. We reviewed the approximate 1 in 300-500 risk for complications for amniocentesis, including spontaneous pregnancy loss or preterm labor and delivery. After consideration of all the options, she declined NIPS and amniocentesis.    A detailed ultrasound was performed  today. The ultrasound report will be sent under separate cover. There were no visualized fetal anomalies or markers suggestive of aneuploidy. She understands that screening tests cannot rule out all birth defects or genetic syndromes. The patient was advised of this limitation and states she still does not want additional testing at this time.   Both family histories were reviewed and found to be contributory for sickle cell anemia for the patient's paternal half-sister. No additional relatives were reported with sickle cell anemia or sickle cell trait. The patient was unsure if she has previously been screened for sickle cell trait and other hemoglobin variants. Given the reported information, [name] would have a 1 in 2 chance to carry sickle cell trait, prior to screening for her. The father of the pregnancy would have the general population chance to carry sickle cell trait given no known family history and no known consanguinity to the patient.  We reviewed information regarding sickle cell anemia (SCA) including the carrier frequency and incidence in the African population, the availability of carrier testing and prenatal diagnosis if indicated.  In addition, we discussed that hemoglobinopathies are routinely screened for as part of the [place] newborn screening panel.  She declined hemoglobin electrophoresis today. Without further information regarding the provided family history, an accurate genetic risk cannot be calculated. Further genetic counseling is warranted if more information is obtained.  [name] denied exposure to environmental toxins or chemical agents. She denied the use of alcohol, tobacco or street drugs. She denied significant viral illnesses during the course of her pregnancy. Her medical and surgical histories were noncontributory.   I counseled [name] regarding the above risks and available options.  The approximate face-to-face time with the genetic counselor  was 20  minutes.  Amy Plowman, MS,  Certified [employer] 03/22/2013

## 2013-03-22 NOTE — Progress Notes (Addendum)
Amy Santana  was seen today for an ultrasound appointment.  See full report in [facility].  Impression: Single IUP at 23 6/7 weeks Normal fetal anatomic survey Fetal growth is appropriate (64th %tile) Multiple calcified uterine myomas noted (see above) Normal amniotic fluid volume  Patient declined further genetic screening due to advanced maternal age.  Recommendations: Recommend follow up in 4 weeks for interval growth. Recommend antepartum fetal testing beginning at 36 weeks due to advanced maternal age > 33  Alpha Gula, MD

## 2013-03-25 ENCOUNTER — Other Ambulatory Visit: Payer: Medicaid Other

## 2013-03-25 ENCOUNTER — Ambulatory Visit (INDEPENDENT_AMBULATORY_CARE_PROVIDER_SITE_OTHER): Payer: Medicaid Other | Admitting: Obstetrics

## 2013-03-25 ENCOUNTER — Encounter: Payer: Self-pay | Admitting: Obstetrics

## 2013-03-25 VITALS — BP 102/60 | Temp 98.9°F

## 2013-03-25 DIAGNOSIS — Z3480 Encounter for supervision of other normal pregnancy, unspecified trimester: Secondary | ICD-10-CM

## 2013-03-25 DIAGNOSIS — Z3482 Encounter for supervision of other normal pregnancy, second trimester: Secondary | ICD-10-CM

## 2013-03-25 DIAGNOSIS — Z113 Encounter for screening for infections with a predominantly sexual mode of transmission: Secondary | ICD-10-CM

## 2013-03-25 LAB — POCT URINALYSIS DIPSTICK
Blood, UA: NEGATIVE
Nitrite, UA: NEGATIVE
Protein, UA: NEGATIVE
Spec Grav, UA: 1.02
Urobilinogen, UA: NEGATIVE
pH, UA: 5.5

## 2013-03-25 LAB — CBC
HCT: 32.8 % — ABNORMAL LOW (ref 36.0–46.0)
Hemoglobin: 11.3 g/dL — ABNORMAL LOW (ref 12.0–15.0)
MCH: 30 pg (ref 26.0–34.0)
MCHC: 34.5 g/dL (ref 30.0–36.0)
RBC: 3.77 MIL/uL — ABNORMAL LOW (ref 3.87–5.11)

## 2013-03-25 NOTE — Addendum Note (Signed)
Addended by: Julaine Hua on: 03/25/2013 04:39 PM   Modules accepted: Orders

## 2013-03-25 NOTE — Progress Notes (Signed)
Pulse-91 Pt c/o "pain in waist" when standing too long. Pt c/o seasonal nasal congestion and is requesting nasal spray for relief.

## 2013-03-26 LAB — GLUCOSE TOLERANCE, 2 HOURS W/ 1HR: Glucose, 1 hour: 117 mg/dL (ref 70–170)

## 2013-03-26 LAB — HIV ANTIBODY (ROUTINE TESTING W REFLEX): HIV: NONREACTIVE

## 2013-04-07 ENCOUNTER — Encounter (HOSPITAL_COMMUNITY): Payer: Self-pay | Admitting: Family

## 2013-04-07 ENCOUNTER — Inpatient Hospital Stay (HOSPITAL_COMMUNITY)
Admission: AD | Admit: 2013-04-07 | Discharge: 2013-04-07 | Disposition: A | Discharge status: Home / Self Care | Payer: Medicaid Other | Source: Ambulatory Visit | Attending: Obstetrics | Admitting: Obstetrics

## 2013-04-07 DIAGNOSIS — O4702 False labor before 37 completed weeks of gestation, second trimester: Secondary | ICD-10-CM

## 2013-04-07 DIAGNOSIS — O479 False labor, unspecified: Secondary | ICD-10-CM

## 2013-04-07 DIAGNOSIS — O4700 False labor before 37 completed weeks of gestation, unspecified trimester: Secondary | ICD-10-CM | POA: Insufficient documentation

## 2013-04-07 LAB — WET PREP, GENITAL
Clue Cells Wet Prep HPF POC: NONE SEEN
Trich, Wet Prep: NONE SEEN
Yeast Wet Prep HPF POC: NONE SEEN

## 2013-04-07 LAB — OB RESULTS CONSOLE GC/CHLAMYDIA
Chlamydia: NEGATIVE
Gonorrhea: NEGATIVE

## 2013-04-07 LAB — URINALYSIS, ROUTINE W REFLEX MICROSCOPIC
Nitrite: NEGATIVE
Specific Gravity, Urine: 1.015 (ref 1.005–1.030)
Urobilinogen, UA: 0.2 mg/dL (ref 0.0–1.0)
pH: 6 (ref 5.0–8.0)

## 2013-04-07 LAB — URINE MICROSCOPIC-ADD ON

## 2013-04-07 NOTE — MAU Note (Signed)
Patient presents to MAU with c/o contractions since yesterday. Reports +FM. Denies VB, LOF, or hx of preterm labor. Denies HSV.

## 2013-04-07 NOTE — MAU Provider Note (Signed)
History     CSN: [id number]  Arrival date and time: 04/07/13 1327   First Provider Initiated Contact with Patient 04/07/13 1412      Chief Complaint  Patient presents with  . Contractions   HPI Amy Santana is a 42 y.o. G3P2002 at [redacted]w[redacted]d. She presents with c/o contractions since yesterday, they are10 m apart. No leaking, change in discharge or bleeding, good fetal activity. She has frequency, no urgency or burning.   OB History   Grav Para Term Preterm Abortions TAB SAB Ect Mult Living   3 2 2  0 0 0 0 0 2 2      Past Medical History  Diagnosis Date  . No pertinent past medical history   . Uterine fibroid     Past Surgical History  Procedure Laterality Date  . Nasal polyp excision    . Wisdom tooth extraction      Family History  Problem Relation Age of Onset  . Hypertension Mother     History  Substance Use Topics  . Smoking status: Never Smoker   . Smokeless tobacco: Never Used  . Alcohol Use: No    Allergies: No Known Allergies  Prescriptions prior to admission  Medication Sig Dispense Refill  . loratadine (CLARITIN) 10 MG tablet Take 1 tablet (10 mg total) by mouth daily.  30 tablet  11  . Prenatal Vit-Fe Fumarate-FA (PRENATAL PO) Take 1 tablet by mouth daily.        Review of Systems  Constitutional: Negative for fever and chills.  Gastrointestinal: Negative for nausea, vomiting, diarrhea and constipation.  Genitourinary: Positive for frequency. Negative for dysuria and urgency.       Denies leaking or bleeding Admits contractions   Physical Exam   Blood pressure 109/65, pulse 84, resp. rate 16, last menstrual period 10/22/2012.  Physical Exam  Constitutional: She is oriented to person, place, and time. She appears well-developed and well-nourished.  GI: Soft. There is no tenderness. There is no rebound and no guarding.  Genitourinary:  Pelvic exam: Ext genitalia- nl anatomy, skin intact Vagina- small amt white mucoid discharge Cx  exam by RN  Cx-parous, 1-2 cm external os, closed internal os, soft Uterus- gravid  Musculoskeletal: Normal range of motion.  Neurological: She is alert and oriented to person, place, and time.  Skin: Skin is warm and dry.  Psychiatric: She has a normal mood and affect. Her behavior is normal.    MAU Course  Procedures  Results for orders placed during the hospital encounter of 04/07/13 (from the past 24 hour(s))  URINALYSIS, ROUTINE W REFLEX MICROSCOPIC     Status: Abnormal   Collection Time    04/07/13  1:48 PM      Result Value Range   Color, Urine YELLOW  YELLOW   APPearance CLEAR  CLEAR   Specific Gravity, Urine 1.015  1.005 - 1.030   pH 6.0  5.0 - 8.0   Glucose, UA NEGATIVE  NEGATIVE mg/dL   Hgb urine dipstick SMALL (*) NEGATIVE   Bilirubin Urine NEGATIVE  NEGATIVE   Ketones, ur NEGATIVE  NEGATIVE mg/dL   Protein, ur NEGATIVE  NEGATIVE mg/dL   Urobilinogen, UA 0.2  0.0 - 1.0 mg/dL   Nitrite NEGATIVE  NEGATIVE   Leukocytes, UA NEGATIVE  NEGATIVE  URINE MICROSCOPIC-ADD ON     Status: None   Collection Time    04/07/13  1:48 PM      Result Value Range   Squamous Epithelial / LPF RARE  RARE   RBC / HPF 3-6  <3 RBC/hpf   Urine-Other MUCOUS PRESENT    WET PREP, GENITAL     Status: Abnormal   Collection Time    04/07/13  2:30 PM      Result Value Range   Yeast Wet Prep HPF POC NONE SEEN  NONE SEEN   Trich, Wet Prep NONE SEEN  NONE SEEN   Clue Cells Wet Prep HPF POC NONE SEEN  NONE SEEN   WBC, Wet Prep HPF POC MODERATE (*) NONE SEEN   Reassuring FM strip Occ mild contractions  Assessment and Plan  ASSESSMENT:  26 1/7 weeks  Cx closed Reassuring strip  PLAN:  Increase fluids Keep PNV  7/17 with Dr Clearance Coots Call the office if symptoms increase   Nikolai Wilczak M. 04/07/2013, 2:14 PM

## 2013-04-08 LAB — GC/CHLAMYDIA PROBE AMP
CT Probe RNA: NEGATIVE
GC Probe RNA: NEGATIVE

## 2013-04-11 ENCOUNTER — Ambulatory Visit (INDEPENDENT_AMBULATORY_CARE_PROVIDER_SITE_OTHER): Payer: Medicaid Other | Admitting: Obstetrics

## 2013-04-11 VITALS — BP 104/62 | Temp 98.2°F | Wt 171.0 lb

## 2013-04-11 DIAGNOSIS — Z3480 Encounter for supervision of other normal pregnancy, unspecified trimester: Secondary | ICD-10-CM

## 2013-04-11 DIAGNOSIS — Z3483 Encounter for supervision of other normal pregnancy, third trimester: Secondary | ICD-10-CM

## 2013-04-11 LAB — POCT URINALYSIS DIPSTICK
Bilirubin, UA: NEGATIVE
Glucose, UA: NEGATIVE
Ketones, UA: NEGATIVE
Nitrite, UA: NEGATIVE

## 2013-04-11 NOTE — Progress Notes (Signed)
Pulse 88 patient state she has no concerns

## 2013-04-12 ENCOUNTER — Encounter: Payer: Self-pay | Admitting: Obstetrics

## 2013-04-19 ENCOUNTER — Ambulatory Visit (HOSPITAL_COMMUNITY)
Admission: RE | Admit: 2013-04-19 | Discharge: 2013-04-19 | Disposition: A | Discharge status: Home / Self Care | Payer: Medicaid Other | Source: Ambulatory Visit | Attending: Obstetrics | Admitting: Obstetrics

## 2013-04-19 ENCOUNTER — Encounter: Payer: Self-pay | Admitting: *Deleted

## 2013-04-19 DIAGNOSIS — O09529 Supervision of elderly multigravida, unspecified trimester: Secondary | ICD-10-CM | POA: Insufficient documentation

## 2013-04-19 DIAGNOSIS — O3410 Maternal care for benign tumor of corpus uteri, unspecified trimester: Secondary | ICD-10-CM | POA: Insufficient documentation

## 2013-04-19 DIAGNOSIS — D259 Leiomyoma of uterus, unspecified: Secondary | ICD-10-CM

## 2013-04-19 NOTE — Progress Notes (Signed)
Amy Santana  was seen today for an ultrasound appointment.  See full report in [facility].  Impression: Single IUP at 27 6/7 weeks Normal interval anatomy Fetal growth is appropriate (67th %tile) Multiple calcified uterine myomas noted (see above)- unchanged in size since previous evaluation Normal amniotic fluid volume  Recommendations: Recommend follow up in 4 weeks for interval growth. Recommend antepartum fetal testing beginning at 36 weeks due to advanced maternal age > 38  Alpha Gula, MD

## 2013-04-25 ENCOUNTER — Ambulatory Visit (INDEPENDENT_AMBULATORY_CARE_PROVIDER_SITE_OTHER): Payer: Medicaid Other | Admitting: Obstetrics

## 2013-04-25 VITALS — BP 102/58 | Temp 98.2°F | Wt 170.0 lb

## 2013-04-25 DIAGNOSIS — Z3480 Encounter for supervision of other normal pregnancy, unspecified trimester: Secondary | ICD-10-CM

## 2013-04-25 DIAGNOSIS — Z3483 Encounter for supervision of other normal pregnancy, third trimester: Secondary | ICD-10-CM

## 2013-04-25 LAB — POCT URINALYSIS DIPSTICK
Glucose, UA: NEGATIVE
Leukocytes, UA: NEGATIVE
Nitrite, UA: NEGATIVE
Urobilinogen, UA: NEGATIVE

## 2013-04-25 NOTE — Progress Notes (Signed)
Pulse-102 Pt c/o intermittent vaginal pain x 1 week, worse in the mornings.

## 2013-05-09 ENCOUNTER — Encounter: Payer: Medicaid Other | Admitting: Obstetrics

## 2013-05-10 ENCOUNTER — Encounter: Payer: Self-pay | Admitting: Obstetrics

## 2013-05-10 ENCOUNTER — Ambulatory Visit (INDEPENDENT_AMBULATORY_CARE_PROVIDER_SITE_OTHER): Payer: Medicaid Other | Admitting: Obstetrics

## 2013-05-10 VITALS — BP 90/60 | Temp 98.5°F | Wt 170.0 lb

## 2013-05-10 DIAGNOSIS — Z3483 Encounter for supervision of other normal pregnancy, third trimester: Secondary | ICD-10-CM

## 2013-05-10 DIAGNOSIS — Z3480 Encounter for supervision of other normal pregnancy, unspecified trimester: Secondary | ICD-10-CM

## 2013-05-10 LAB — POCT URINALYSIS DIPSTICK
Leukocytes, UA: NEGATIVE
Nitrite, UA: NEGATIVE
Urobilinogen, UA: NEGATIVE

## 2013-05-10 NOTE — Progress Notes (Signed)
Pulse 118, patient states she has no concerns, pulse recheck- 101

## 2013-05-16 ENCOUNTER — Ambulatory Visit (HOSPITAL_COMMUNITY)
Admission: RE | Admit: 2013-05-16 | Discharge: 2013-05-16 | Disposition: A | Discharge status: Home / Self Care | Payer: Medicaid Other | Source: Ambulatory Visit | Attending: Obstetrics | Admitting: Obstetrics

## 2013-05-16 DIAGNOSIS — O09529 Supervision of elderly multigravida, unspecified trimester: Secondary | ICD-10-CM | POA: Insufficient documentation

## 2013-05-16 DIAGNOSIS — O3410 Maternal care for benign tumor of corpus uteri, unspecified trimester: Secondary | ICD-10-CM | POA: Insufficient documentation

## 2013-05-16 DIAGNOSIS — D259 Leiomyoma of uterus, unspecified: Secondary | ICD-10-CM

## 2013-05-22 ENCOUNTER — Encounter: Payer: Self-pay | Admitting: Obstetrics

## 2013-05-22 ENCOUNTER — Ambulatory Visit (INDEPENDENT_AMBULATORY_CARE_PROVIDER_SITE_OTHER): Payer: Medicaid Other | Admitting: Obstetrics

## 2013-05-22 VITALS — BP 109/72 | Temp 98.4°F | Wt 168.2 lb

## 2013-05-22 DIAGNOSIS — Z3483 Encounter for supervision of other normal pregnancy, third trimester: Secondary | ICD-10-CM

## 2013-05-22 DIAGNOSIS — Z3480 Encounter for supervision of other normal pregnancy, unspecified trimester: Secondary | ICD-10-CM

## 2013-05-22 NOTE — Progress Notes (Signed)
Pulse: 91

## 2013-05-23 LAB — POCT URINALYSIS DIPSTICK
Ketones, UA: 2
Leukocytes, UA: NEGATIVE
Nitrite, UA: NEGATIVE
pH, UA: 6

## 2013-05-26 ENCOUNTER — Encounter (HOSPITAL_COMMUNITY): Payer: Self-pay | Admitting: *Deleted

## 2013-05-26 ENCOUNTER — Inpatient Hospital Stay (HOSPITAL_COMMUNITY)
Admission: AD | Admit: 2013-05-26 | Discharge: 2013-05-27 | Disposition: A | Discharge status: Home / Self Care | Payer: Medicaid Other | Source: Ambulatory Visit | Attending: Obstetrics & Gynecology | Admitting: Obstetrics & Gynecology

## 2013-05-26 DIAGNOSIS — J3489 Other specified disorders of nose and nasal sinuses: Secondary | ICD-10-CM | POA: Insufficient documentation

## 2013-05-26 DIAGNOSIS — Z658 Other specified problems related to psychosocial circumstances: Secondary | ICD-10-CM

## 2013-05-26 DIAGNOSIS — O99891 Other specified diseases and conditions complicating pregnancy: Secondary | ICD-10-CM | POA: Insufficient documentation
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
## 2013-05-27 DIAGNOSIS — J3489 Other specified disorders of nose and nasal sinuses: Secondary | ICD-10-CM

## 2013-05-27 DIAGNOSIS — O9989 Other specified diseases and conditions complicating pregnancy, childbirth and the puerperium: Secondary | ICD-10-CM

## 2013-05-27 LAB — URINALYSIS, ROUTINE W REFLEX MICROSCOPIC
Bilirubin Urine: NEGATIVE
Ketones, ur: NEGATIVE mg/dL
Nitrite: NEGATIVE
pH: 5.5 (ref 5.0–8.0)

## 2013-05-27 MED ORDER — FLUTICASONE PROPIONATE 50 MCG/ACT NA SUSP: 2.0000 | Freq: Every day | NASAL | Status: DC

## 2013-05-27 MED ORDER — GI COCKTAIL ~~LOC~~
30.0000 mL | Freq: Once | ORAL | Status: DC
  Filled 2013-05-27: qty 30
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
## 2013-06-05 ENCOUNTER — Encounter: Payer: Self-pay | Admitting: Obstetrics

## 2013-06-05 ENCOUNTER — Ambulatory Visit (INDEPENDENT_AMBULATORY_CARE_PROVIDER_SITE_OTHER): Payer: Medicaid Other | Admitting: Obstetrics

## 2013-06-05 VITALS — BP 105/69 | Temp 98.1°F | Wt 171.0 lb

## 2013-06-05 DIAGNOSIS — Z3483 Encounter for supervision of other normal pregnancy, third trimester: Secondary | ICD-10-CM

## 2013-06-05 DIAGNOSIS — Z3480 Encounter for supervision of other normal pregnancy, unspecified trimester: Secondary | ICD-10-CM

## 2013-06-05 LAB — POCT URINALYSIS DIPSTICK
Bilirubin, UA: NEGATIVE
Blood, UA: NEGATIVE
Glucose, UA: NEGATIVE
Spec Grav, UA: 1.01
Urobilinogen, UA: NEGATIVE

## 2013-06-05 MED ORDER — PREDNISONE (PAK) 10 MG PO TABS: ORAL_TABLET | ORAL | Status: DC

## 2013-06-05 NOTE — Progress Notes (Signed)
P=93 

## 2013-06-13 ENCOUNTER — Other Ambulatory Visit: Payer: Self-pay | Admitting: Obstetrics

## 2013-06-13 DIAGNOSIS — O09529 Supervision of elderly multigravida, unspecified trimester: Secondary | ICD-10-CM

## 2013-06-14 ENCOUNTER — Other Ambulatory Visit: Payer: Self-pay | Admitting: Obstetrics

## 2013-06-14 ENCOUNTER — Encounter (HOSPITAL_COMMUNITY): Payer: Self-pay

## 2013-06-14 ENCOUNTER — Ambulatory Visit (HOSPITAL_COMMUNITY)
Admission: RE | Admit: 2013-06-14 | Discharge: 2013-06-14 | Disposition: A | Discharge status: Home / Self Care | Payer: Medicaid Other | Source: Ambulatory Visit | Attending: Obstetrics | Admitting: Obstetrics

## 2013-06-14 DIAGNOSIS — O09529 Supervision of elderly multigravida, unspecified trimester: Secondary | ICD-10-CM | POA: Insufficient documentation

## 2013-06-14 DIAGNOSIS — O3410 Maternal care for benign tumor of corpus uteri, unspecified trimester: Secondary | ICD-10-CM | POA: Insufficient documentation

## 2013-06-14 NOTE — Progress Notes (Signed)
Adore Spath  was seen today for an ultrasound appointment.  See full report in [facility].  Impression: IUP at 35+6 weeks Normal interval anatomy; anatomic survey complete Normal amniotic fluid volume  Appropriate interval growth with EFW at the 86th %tile   Active fetus with BPP of 8/8 Fibroid uterus: see above for size and location   Recommendations: Recommend 2x weekly NSTs with weekly AFIs. Recommend delivery by EDD but not prior to [redacted] weeks gestation if testing is otherwise reassuring.  Alpha Gula, MD

## 2013-06-18 ENCOUNTER — Ambulatory Visit (HOSPITAL_COMMUNITY): Admission: RE | Admit: 2013-06-18 | Payer: Medicaid Other | Source: Ambulatory Visit

## 2013-06-18 ENCOUNTER — Other Ambulatory Visit (HOSPITAL_COMMUNITY): Payer: Self-pay | Admitting: Maternal and Fetal Medicine

## 2013-06-18 ENCOUNTER — Ambulatory Visit (HOSPITAL_COMMUNITY)
Admission: RE | Admit: 2013-06-18 | Discharge: 2013-06-18 | Disposition: A | Discharge status: Home / Self Care | Payer: Medicaid Other | Source: Ambulatory Visit | Attending: Obstetrics | Admitting: Obstetrics

## 2013-06-18 DIAGNOSIS — O3410 Maternal care for benign tumor of corpus uteri, unspecified trimester: Secondary | ICD-10-CM | POA: Insufficient documentation

## 2013-06-18 DIAGNOSIS — O09529 Supervision of elderly multigravida, unspecified trimester: Secondary | ICD-10-CM | POA: Insufficient documentation

## 2013-06-19 ENCOUNTER — Encounter: Payer: Medicaid Other | Admitting: Obstetrics

## 2013-06-21 ENCOUNTER — Encounter (HOSPITAL_COMMUNITY): Payer: Self-pay

## 2013-06-21 ENCOUNTER — Ambulatory Visit (HOSPITAL_COMMUNITY): Payer: Medicaid Other

## 2013-06-21 ENCOUNTER — Ambulatory Visit (HOSPITAL_COMMUNITY)
Admission: RE | Admit: 2013-06-21 | Discharge: 2013-06-21 | Disposition: A | Discharge status: Home / Self Care | Payer: Medicaid Other | Source: Ambulatory Visit | Attending: Obstetrics | Admitting: Obstetrics

## 2013-06-21 DIAGNOSIS — O09529 Supervision of elderly multigravida, unspecified trimester: Secondary | ICD-10-CM | POA: Insufficient documentation

## 2013-06-21 DIAGNOSIS — O3410 Maternal care for benign tumor of corpus uteri, unspecified trimester: Secondary | ICD-10-CM | POA: Insufficient documentation

## 2013-06-25 ENCOUNTER — Ambulatory Visit (INDEPENDENT_AMBULATORY_CARE_PROVIDER_SITE_OTHER): Payer: Medicaid Other | Admitting: Obstetrics

## 2013-06-25 ENCOUNTER — Encounter: Payer: Self-pay | Admitting: Obstetrics

## 2013-06-25 VITALS — BP 112/74 | Temp 98.5°F | Wt 169.4 lb

## 2013-06-25 DIAGNOSIS — Z3480 Encounter for supervision of other normal pregnancy, unspecified trimester: Secondary | ICD-10-CM

## 2013-06-25 DIAGNOSIS — Z3483 Encounter for supervision of other normal pregnancy, third trimester: Secondary | ICD-10-CM

## 2013-06-25 DIAGNOSIS — O09529 Supervision of elderly multigravida, unspecified trimester: Secondary | ICD-10-CM

## 2013-06-25 LAB — POCT URINALYSIS DIPSTICK
Bilirubin, UA: NEGATIVE
Glucose, UA: NEGATIVE
Ketones, UA: NEGATIVE
Nitrite, UA: NEGATIVE
Spec Grav, UA: 1.01
Urobilinogen, UA: NEGATIVE

## 2013-06-25 NOTE — Progress Notes (Signed)
Pulse: 93

## 2013-06-26 ENCOUNTER — Encounter: Payer: Medicaid Other | Admitting: Obstetrics

## 2013-06-27 ENCOUNTER — Other Ambulatory Visit: Payer: Self-pay | Admitting: Obstetrics

## 2013-06-27 DIAGNOSIS — O09529 Supervision of elderly multigravida, unspecified trimester: Secondary | ICD-10-CM

## 2013-06-28 ENCOUNTER — Ambulatory Visit (HOSPITAL_COMMUNITY): Payer: Medicaid Other

## 2013-06-28 ENCOUNTER — Ambulatory Visit (HOSPITAL_COMMUNITY)
Admission: RE | Admit: 2013-06-28 | Discharge: 2013-06-28 | Disposition: A | Discharge status: Home / Self Care | Payer: Medicaid Other | Source: Ambulatory Visit | Attending: Obstetrics | Admitting: Obstetrics

## 2013-06-28 DIAGNOSIS — O09529 Supervision of elderly multigravida, unspecified trimester: Secondary | ICD-10-CM | POA: Insufficient documentation

## 2013-06-28 DIAGNOSIS — O3410 Maternal care for benign tumor of corpus uteri, unspecified trimester: Secondary | ICD-10-CM | POA: Insufficient documentation

## 2013-06-28 NOTE — Progress Notes (Signed)
Ms. Demauro was seen for ultrasound appointment today.  Please see [facility] report for details.

## 2013-07-02 ENCOUNTER — Encounter: Payer: Self-pay | Admitting: Obstetrics

## 2013-07-02 ENCOUNTER — Ambulatory Visit (INDEPENDENT_AMBULATORY_CARE_PROVIDER_SITE_OTHER): Payer: Medicaid Other | Admitting: Obstetrics

## 2013-07-02 VITALS — BP 122/80 | Temp 98.3°F | Wt 170.6 lb

## 2013-07-02 DIAGNOSIS — Z3480 Encounter for supervision of other normal pregnancy, unspecified trimester: Secondary | ICD-10-CM

## 2013-07-02 DIAGNOSIS — Z3483 Encounter for supervision of other normal pregnancy, third trimester: Secondary | ICD-10-CM

## 2013-07-02 LAB — POCT URINALYSIS DIPSTICK
Glucose, UA: NEGATIVE
Leukocytes, UA: NEGATIVE
Nitrite, UA: NEGATIVE
Protein, UA: NEGATIVE
Urobilinogen, UA: NEGATIVE

## 2013-07-02 NOTE — Progress Notes (Signed)
Pulse: 88

## 2013-07-03 ENCOUNTER — Other Ambulatory Visit (HOSPITAL_COMMUNITY): Payer: Self-pay | Admitting: Obstetrics

## 2013-07-03 ENCOUNTER — Other Ambulatory Visit: Payer: Self-pay | Admitting: Obstetrics

## 2013-07-03 DIAGNOSIS — Z369 Encounter for antenatal screening, unspecified: Secondary | ICD-10-CM

## 2013-07-05 ENCOUNTER — Ambulatory Visit (HOSPITAL_COMMUNITY)
Admission: RE | Admit: 2013-07-05 | Discharge: 2013-07-05 | Disposition: A | Discharge status: Home / Self Care | Payer: Medicaid Other | Source: Ambulatory Visit | Attending: Obstetrics | Admitting: Obstetrics

## 2013-07-05 DIAGNOSIS — O09529 Supervision of elderly multigravida, unspecified trimester: Secondary | ICD-10-CM | POA: Insufficient documentation

## 2013-07-05 DIAGNOSIS — Z369 Encounter for antenatal screening, unspecified: Secondary | ICD-10-CM

## 2013-07-05 DIAGNOSIS — O3410 Maternal care for benign tumor of corpus uteri, unspecified trimester: Secondary | ICD-10-CM | POA: Insufficient documentation

## 2013-07-05 NOTE — Progress Notes (Signed)
Amy Santana  was seen today for an ultrasound appointment.  See full report in [facility].  Impression: Single IUP at 38 6/7 weeks Advanced maternal age > 40 Limited ultrasound performed for amniotic fluid assessment AFI  12.3 cm Reactive NST - normal modified BPP  Recommendations: Continue 2x weekly NSTs with weekly AFIs. Recommend delivery by EDD.  Alpha Gula, MD

## 2013-07-05 NOTE — ED Notes (Signed)
Pt has appt with primary on Tuesday.  Called office and had NST added to that appointment.

## 2013-07-08 ENCOUNTER — Encounter (HOSPITAL_COMMUNITY): Payer: Self-pay | Admitting: *Deleted

## 2013-07-08 ENCOUNTER — Inpatient Hospital Stay (HOSPITAL_COMMUNITY)
Admission: AD | Admit: 2013-07-08 | Discharge: 2013-07-08 | Disposition: A | Discharge status: Home / Self Care | Payer: Medicaid Other | Source: Ambulatory Visit | Attending: Obstetrics & Gynecology | Admitting: Obstetrics & Gynecology

## 2013-07-08 ENCOUNTER — Encounter: Payer: Self-pay | Admitting: Obstetrics

## 2013-07-08 ENCOUNTER — Ambulatory Visit (INDEPENDENT_AMBULATORY_CARE_PROVIDER_SITE_OTHER): Payer: Medicaid Other | Admitting: Obstetrics

## 2013-07-08 ENCOUNTER — Inpatient Hospital Stay (HOSPITAL_COMMUNITY)
Admission: AD | Admit: 2013-07-08 | Discharge: 2013-07-11 | DRG: 775 | Disposition: A | Discharge status: Home / Self Care | Payer: Medicaid Other | Source: Ambulatory Visit | Attending: Obstetrics & Gynecology | Admitting: Obstetrics & Gynecology

## 2013-07-08 VITALS — BP 131/84 | Temp 98.9°F | Wt 169.0 lb

## 2013-07-08 DIAGNOSIS — Z3480 Encounter for supervision of other normal pregnancy, unspecified trimester: Secondary | ICD-10-CM

## 2013-07-08 DIAGNOSIS — O09529 Supervision of elderly multigravida, unspecified trimester: Secondary | ICD-10-CM | POA: Diagnosis present

## 2013-07-08 DIAGNOSIS — O0990 Supervision of high risk pregnancy, unspecified, unspecified trimester: Secondary | ICD-10-CM

## 2013-07-08 DIAGNOSIS — O479 False labor, unspecified: Secondary | ICD-10-CM | POA: Insufficient documentation

## 2013-07-08 DIAGNOSIS — Z3483 Encounter for supervision of other normal pregnancy, third trimester: Secondary | ICD-10-CM

## 2013-07-08 DIAGNOSIS — O26859 Spotting complicating pregnancy, unspecified trimester: Secondary | ICD-10-CM | POA: Insufficient documentation

## 2013-07-08 LAB — POCT URINALYSIS DIPSTICK
Protein, UA: NEGATIVE
Spec Grav, UA: <=1.005
Urobilinogen, UA: NEGATIVE
pH, UA: 5

## 2013-07-08 MED ORDER — LACTATED RINGERS IV SOLN
INTRAVENOUS | Status: DC
  Administered 2013-07-08: via INTRAVENOUS
  Administered 2013-07-09: 125 mL/h via INTRAVENOUS
  Administered 2013-07-09: 11:00:00 via INTRAVENOUS

## 2013-07-08 NOTE — MAU Note (Signed)
PT  SAYS SHE LEFT AT 7PM-  UNABLE TO SLEEP-   VE-   3 CM.    DENIES HSV AND MRSA.

## 2013-07-08 NOTE — MAU Note (Signed)
Contractions X 2 days. Never saw any spotting or fluid so she did not come to the hospital. Noted spotting around 1200 today. Contractions not real close, but more painful.

## 2013-07-08 NOTE — Progress Notes (Signed)
P 91 Patient states she has been having contractions since last night.

## 2013-07-09 ENCOUNTER — Encounter: Payer: Medicaid Other | Admitting: Obstetrics

## 2013-07-09 ENCOUNTER — Encounter (HOSPITAL_COMMUNITY): Payer: Self-pay | Admitting: *Deleted

## 2013-07-09 LAB — CBC
Hemoglobin: 12.5 g/dL (ref 12.0–15.0)
MCHC: 34.2 g/dL (ref 30.0–36.0)
MCV: 89 fL (ref 78.0–100.0)
Platelets: 144 10*3/uL — ABNORMAL LOW (ref 150–400)
RBC: 4.1 MIL/uL (ref 3.87–5.11)

## 2013-07-09 LAB — RPR: RPR Ser Ql: NONREACTIVE

## 2013-07-09 MED ORDER — WITCH HAZEL-GLYCERIN EX PADS: 1.0000 "application " | MEDICATED_PAD | CUTANEOUS | Status: DC | PRN

## 2013-07-09 MED ORDER — SIMETHICONE 80 MG PO CHEW: 80.0000 mg | CHEWABLE_TABLET | ORAL | Status: DC | PRN

## 2013-07-09 MED ORDER — TERBUTALINE SULFATE 1 MG/ML IJ SOLN: 0.2500 mg | Freq: Once | INTRAMUSCULAR | Status: DC | PRN

## 2013-07-09 MED ORDER — FLEET ENEMA 7-19 GM/118ML RE ENEM: 1.0000 | ENEMA | RECTAL | Status: DC | PRN

## 2013-07-09 MED ORDER — LACTATED RINGERS IV SOLN: 500.0000 mL | INTRAVENOUS | Status: DC | PRN

## 2013-07-09 MED ORDER — FENTANYL 2.5 MCG/ML BUPIVACAINE 1/10 % EPIDURAL INFUSION (WH - ANES): 14.0000 mL/h | INTRAMUSCULAR | Status: DC | PRN

## 2013-07-09 MED ORDER — INFLUENZA VAC SPLIT QUAD 0.5 ML IM SUSP
0.5000 mL | INTRAMUSCULAR | Status: AC
  Administered 2013-07-10: 0.5 mL via INTRAMUSCULAR
  Filled 2013-07-09: qty 0.5

## 2013-07-09 MED ORDER — NALBUPHINE SYRINGE 5 MG/0.5 ML
10.0000 mg | INJECTION | Freq: Four times a day (QID) | INTRAMUSCULAR | Status: DC | PRN
  Filled 2013-07-09 (×2): qty 1

## 2013-07-09 MED ORDER — TETANUS-DIPHTH-ACELL PERTUSSIS 5-2.5-18.5 LF-MCG/0.5 IM SUSP
0.5000 mL | Freq: Once | INTRAMUSCULAR | Status: AC
  Administered 2013-07-10: 0.5 mL via INTRAMUSCULAR
  Filled 2013-07-09: qty 0.5

## 2013-07-09 MED ORDER — DIBUCAINE 1 % RE OINT: 1.0000 "application " | TOPICAL_OINTMENT | RECTAL | Status: DC | PRN

## 2013-07-09 MED ORDER — OXYCODONE-ACETAMINOPHEN 5-325 MG PO TABS: 1.0000 | ORAL_TABLET | ORAL | Status: DC | PRN

## 2013-07-09 MED ORDER — ONDANSETRON HCL 4 MG/2ML IJ SOLN: 4.0000 mg | INTRAMUSCULAR | Status: DC | PRN

## 2013-07-09 MED ORDER — DIPHENHYDRAMINE HCL 50 MG/ML IJ SOLN: 12.5000 mg | INTRAMUSCULAR | Status: DC | PRN

## 2013-07-09 MED ORDER — EPHEDRINE 5 MG/ML INJ
10.0000 mg | INTRAVENOUS | Status: DC | PRN
  Filled 2013-07-09: qty 2

## 2013-07-09 MED ORDER — LIDOCAINE HCL (PF) 1 % IJ SOLN
30.0000 mL | INTRAMUSCULAR | Status: DC | PRN
  Filled 2013-07-09: qty 30

## 2013-07-09 MED ORDER — SENNOSIDES-DOCUSATE SODIUM 8.6-50 MG PO TABS
2.0000 | ORAL_TABLET | ORAL | Status: DC
  Administered 2013-07-10 (×2): 2 via ORAL
  Filled 2013-07-09 (×2): qty 2

## 2013-07-09 MED ORDER — IBUPROFEN 600 MG PO TABS
600.0000 mg | ORAL_TABLET | Freq: Four times a day (QID) | ORAL | Status: DC
  Administered 2013-07-09 – 2013-07-11 (×7): 600 mg via ORAL
  Filled 2013-07-09 (×8): qty 1

## 2013-07-09 MED ORDER — IBUPROFEN 600 MG PO TABS
600.0000 mg | ORAL_TABLET | Freq: Four times a day (QID) | ORAL | Status: DC | PRN
  Administered 2013-07-09: 600 mg via ORAL
  Filled 2013-07-09: qty 1

## 2013-07-09 MED ORDER — PHENYLEPHRINE 40 MCG/ML (10ML) SYRINGE FOR IV PUSH (FOR BLOOD PRESSURE SUPPORT)
80.0000 ug | PREFILLED_SYRINGE | INTRAVENOUS | Status: DC | PRN
  Filled 2013-07-09: qty 2

## 2013-07-09 MED ORDER — PROMETHAZINE HCL 25 MG/ML IJ SOLN: 25.0000 mg | Freq: Four times a day (QID) | INTRAMUSCULAR | Status: DC | PRN

## 2013-07-09 MED ORDER — OXYTOCIN BOLUS FROM INFUSION: 500.0000 mL | INTRAVENOUS | Status: DC

## 2013-07-09 MED ORDER — OXYTOCIN 40 UNITS IN LACTATED RINGERS INFUSION - SIMPLE MED: 62.5000 mL/h | INTRAVENOUS | Status: DC

## 2013-07-09 MED ORDER — DIPHENHYDRAMINE HCL 25 MG PO CAPS: 25.0000 mg | ORAL_CAPSULE | Freq: Four times a day (QID) | ORAL | Status: DC | PRN

## 2013-07-09 MED ORDER — LACTATED RINGERS IV SOLN: 500.0000 mL | Freq: Once | INTRAVENOUS | Status: DC

## 2013-07-09 MED ORDER — NALBUPHINE SYRINGE 5 MG/0.5 ML
10.0000 mg | INJECTION | INTRAMUSCULAR | Status: DC | PRN
  Administered 2013-07-09: 10 mg via INTRAVENOUS
  Filled 2013-07-09: qty 1

## 2013-07-09 MED ORDER — OXYTOCIN 40 UNITS IN LACTATED RINGERS INFUSION - SIMPLE MED
1.0000 m[IU]/min | INTRAVENOUS | Status: DC
  Administered 2013-07-09: 2 m[IU]/min via INTRAVENOUS
  Filled 2013-07-09: qty 1000

## 2013-07-09 MED ORDER — ACETAMINOPHEN 325 MG PO TABS: 650.0000 mg | ORAL_TABLET | ORAL | Status: DC | PRN

## 2013-07-09 MED ORDER — PRENATAL MULTIVITAMIN CH
1.0000 | ORAL_TABLET | Freq: Every day | ORAL | Status: DC
  Administered 2013-07-10 – 2013-07-11 (×2): 1 via ORAL
  Filled 2013-07-09 (×2): qty 1

## 2013-07-09 MED ORDER — ONDANSETRON HCL 4 MG PO TABS: 4.0000 mg | ORAL_TABLET | ORAL | Status: DC | PRN

## 2013-07-09 MED ORDER — BENZOCAINE-MENTHOL 20-0.5 % EX AERO
1.0000 "application " | INHALATION_SPRAY | CUTANEOUS | Status: DC | PRN
  Administered 2013-07-10: 1 via TOPICAL
  Filled 2013-07-09: qty 56

## 2013-07-09 MED ORDER — OXYTOCIN 40 UNITS IN LACTATED RINGERS INFUSION - SIMPLE MED: 62.5000 mL/h | INTRAVENOUS | Status: DC | PRN

## 2013-07-09 MED ORDER — ZOLPIDEM TARTRATE 5 MG PO TABS: 5.0000 mg | ORAL_TABLET | Freq: Every evening | ORAL | Status: DC | PRN

## 2013-07-09 MED ORDER — LANOLIN HYDROUS EX OINT: TOPICAL_OINTMENT | CUTANEOUS | Status: DC | PRN

## 2013-07-09 MED ORDER — ONDANSETRON HCL 4 MG/2ML IJ SOLN: 4.0000 mg | Freq: Four times a day (QID) | INTRAMUSCULAR | Status: DC | PRN

## 2013-07-09 MED ORDER — BUTORPHANOL TARTRATE 1 MG/ML IJ SOLN
1.0000 mg | INTRAMUSCULAR | Status: DC | PRN
  Administered 2013-07-09 (×2): 1 mg via INTRAVENOUS
  Filled 2013-07-09 (×3): qty 1

## 2013-07-09 MED ORDER — CITRIC ACID-SODIUM CITRATE 334-500 MG/5ML PO SOLN: 30.0000 mL | ORAL | Status: DC | PRN

## 2013-07-09 NOTE — H&P (Signed)
Amy Santana is a 42 y.o. female presenting for UC's. Maternal Medical History:  Reason for admission: Contractions.  42 yo G4 P3.  EDC 07-13-13.  Presents with UC's.  Contractions: Onset was 6-12 hours ago.    Fetal activity: Perceived fetal activity is normal.   Last perceived fetal movement was within the past hour.    Prenatal complications: no prenatal complications Prenatal Complications - Diabetes: none.    OB History   Grav Para Term Preterm Abortions TAB SAB Ect Mult Living   3 3 3  0 0 0 0 0 0 3     Past Medical History  Diagnosis Date  . No pertinent past medical history   . Uterine fibroid    Past Surgical History  Procedure Laterality Date  . Nasal polyp excision    . Wisdom tooth extraction     Family History: family history includes Hypertension in her mother. Social History:  reports that she has never smoked. She has never used smokeless tobacco. She reports that she does not drink alcohol or use illicit drugs.   Prenatal Transfer Tool  Maternal Diabetes: No Genetic Screening: Normal Maternal Ultrasounds/Referrals: Normal Fetal Ultrasounds or other Referrals:  Referred to Materal Fetal Medicine  Maternal Substance Abuse:  No Significant Maternal Medications:  None Significant Maternal Lab Results:  None Other Comments:  None  Review of Systems  All other systems reviewed and are negative.    Dilation: 10 Effacement (%): 100 Station: Crowning Exam by:: J.Cox, RN Blood pressure 136/83, pulse 74, temperature 98.4 F (36.9 C), temperature source Oral, resp. rate 19, height 5' (1.524 m), weight 171 lb 6 oz (77.735 kg), last menstrual period 10/22/2012, unknown if currently breastfeeding. Maternal Exam:  Abdomen: Patient reports no abdominal tenderness. Fetal presentation: vertex  Introitus: Normal vulva. Normal vagina.  Pelvis: adequate for delivery.   Cervix: Cervix evaluated by digital exam.     Physical Exam  Nursing note and vitals  reviewed. Constitutional: She appears well-developed and well-nourished.  HENT:  Head: Normocephalic and atraumatic.  Eyes: Conjunctivae are normal. Pupils are equal, round, and reactive to light.  Neck: Normal range of motion. Neck supple.  Cardiovascular: Normal rate and regular rhythm.   Respiratory: Effort normal and breath sounds normal.  GI: Soft.  Musculoskeletal: Normal range of motion.  Skin: Skin is warm and dry.  Psychiatric: She has a normal mood and affect. Her behavior is normal. Judgment and thought content normal.    Prenatal labs: ABO, Rh: --/--/B POS (10/14 0125) Antibody: NEG (10/14 0125) Rubella: 12.50 (04/10 1741) RPR: NON REACTIVE (10/13 2350)  HBsAg: NEGATIVE (04/10 1741)  HIV: NON REACTIVE (06/30 1645)  GBS: NEGATIVE (09/30 1318)   Assessment/Plan: 39.3 weeks.  Active labor.  Admit.   Antwane Grose A 07/09/2013, 1:24 PM

## 2013-07-09 NOTE — Progress Notes (Signed)
Josephyne Tarter is a 42 y.o. G3P2002 at [redacted]w[redacted]d by LMP admitted for active labor  Subjective:   Objective: BP 146/77  Pulse 86  Temp(Src) 98.4 F (36.9 C) (Oral)  Resp 18  Ht 5' (1.524 m)  Wt 171 lb 6 oz (77.735 kg)  BMI 33.47 kg/m2  LMP 10/22/2012      FHT:  FHR: 150-160 bpm, variability: moderate,  accelerations:  Present,  decelerations:  Present variable UC:   regular, every 2-3 minutes SVE:   Dilation: 8 Effacement (%): 90 Station: -1 Exam by:: J.Cox, RN  Labs: Lab Results  Component Value Date   WBC 5.5 07/08/2013   HGB 12.5 07/08/2013   HCT 36.5 07/08/2013   MCV 89.0 07/08/2013   PLT 144* 07/08/2013    Assessment / Plan: Augmentation of labor, progressing well  Labor: Progressing normally Preeclampsia:  n/a Fetal Wellbeing:  Category I Pain Control:  Stadol I/D:  n/a Anticipated MOD:  NSVD  HARPER,CHARLES A 07/09/2013, 12:29 PM

## 2013-07-09 NOTE — Progress Notes (Signed)
Req faculty back-up

## 2013-07-10 LAB — CBC
Hemoglobin: 11.3 g/dL — ABNORMAL LOW (ref 12.0–15.0)
MCH: 30.6 pg (ref 26.0–34.0)
MCHC: 34.6 g/dL (ref 30.0–36.0)
Platelets: 140 10*3/uL — ABNORMAL LOW (ref 150–400)
RDW: 13.8 % (ref 11.5–15.5)
WBC: 8.4 10*3/uL (ref 4.0–10.5)

## 2013-07-10 NOTE — Progress Notes (Signed)
Post Partum Day 1 Subjective: no complaints  Objective: Blood pressure 107/66, pulse 84, temperature 98 F (36.7 C), temperature source Oral, resp. rate 18, height 5' (1.524 m), weight 171 lb 6 oz (77.735 kg), last menstrual period 10/22/2012, unknown if currently breastfeeding.  Physical Exam:  General: alert and no distress Lochia: appropriate Uterine Fundus: firm Incision: None DVT Evaluation: No evidence of DVT seen on physical exam.   Recent Labs  07/08/13 2350  HGB 12.5  HCT 36.5    Assessment/Plan: Plan for discharge tomorrow   LOS: 2 days   Shadai Mcclane A 07/10/2013, 5:53 AM

## 2013-07-11 MED ORDER — OXYCODONE-ACETAMINOPHEN 5-325 MG PO TABS: 1.0000 | ORAL_TABLET | ORAL | Status: DC | PRN

## 2013-07-11 MED ORDER — IBUPROFEN 600 MG PO TABS: 600.0000 mg | ORAL_TABLET | Freq: Four times a day (QID) | ORAL | Status: DC | PRN

## 2013-07-11 NOTE — Discharge Summary (Signed)
Obstetric Discharge Summary Reason for Admission: onset of labor Prenatal Procedures: NST, Ultrasound Intrapartum Procedures: spontaneous vaginal delivery Postpartum Procedures: none Complications-Operative and Postpartum: none Hemoglobin  Date Value Range Status  07/10/2013 11.3* 12.0 - 15.0 g/dL Final     HCT  Date Value Range Status  07/10/2013 32.7* 36.0 - 46.0 % Final    Physical Exam:  General: alert and no distress Lochia: appropriate Uterine Fundus: firm Incision: none DVT Evaluation: No evidence of DVT seen on physical exam.  Discharge Diagnoses: Term Pregnancy-delivered  Discharge Information: Date: 07/11/2013 Activity: pelvic rest Diet: routine Medications: PNV, Ibuprofen, Colace and Percocet Condition: stable Instructions: refer to practice specific booklet Discharge to: home Follow-up Information   Follow up with Lucile Didonato A, MD. Schedule an appointment as soon as possible for a visit in 2 weeks.   Specialty:  Obstetrics and Gynecology   Contact information:   [address] [place] [address] [phone]       Newborn Data: Live born female  Birth Weight: 8 lb 4.6 oz (3760 g) APGAR: 9, 9  Home with mother.  Cerra Eisenhower A 07/11/2013, 8:10 AM

## 2013-07-11 NOTE — Progress Notes (Signed)
Post Partum Day 2 Subjective: no complaints  Objective: Blood pressure 114/77, pulse 80, temperature 97.8 F (36.6 C), temperature source Oral, resp. rate 16, height 5' (1.524 m), weight 171 lb 6 oz (77.735 kg), last menstrual period 10/22/2012, unknown if currently breastfeeding.  Physical Exam:  General: alert and no distress Lochia: appropriate Uterine Fundus: firm Incision: none DVT Evaluation: No evidence of DVT seen on physical exam.   Recent Labs  07/08/13 2350 07/10/13 0545  HGB 12.5 11.3*  HCT 36.5 32.7*    Assessment/Plan: Discharge home   LOS: 3 days   Madhuri Vacca A 07/11/2013, 8:07 AM

## 2013-07-17 ENCOUNTER — Encounter: Payer: Medicaid Other | Admitting: Obstetrics

## 2013-09-19 ENCOUNTER — Emergency Department (HOSPITAL_COMMUNITY)
Admission: EM | Admit: 2013-09-19 | Discharge: 2013-09-19 | Disposition: A | Discharge status: Home / Self Care | Payer: Medicaid Other | Attending: Emergency Medicine | Admitting: Emergency Medicine

## 2013-09-19 ENCOUNTER — Encounter (HOSPITAL_COMMUNITY): Payer: Self-pay | Admitting: Emergency Medicine

## 2013-09-19 DIAGNOSIS — R042 Hemoptysis: Secondary | ICD-10-CM | POA: Insufficient documentation

## 2013-09-19 DIAGNOSIS — R509 Fever, unspecified: Secondary | ICD-10-CM | POA: Insufficient documentation

## 2013-09-19 DIAGNOSIS — Z79899 Other long term (current) drug therapy: Secondary | ICD-10-CM | POA: Insufficient documentation

## 2013-09-19 DIAGNOSIS — Z8742 Personal history of other diseases of the female genital tract: Secondary | ICD-10-CM | POA: Insufficient documentation

## 2013-09-19 DIAGNOSIS — J029 Acute pharyngitis, unspecified: Secondary | ICD-10-CM | POA: Insufficient documentation

## 2013-09-19 LAB — RAPID STREP SCREEN (MED CTR MEBANE ONLY): Streptococcus, Group A Screen (Direct): NEGATIVE

## 2013-09-19 NOTE — ED Provider Notes (Signed)
CSN: [id number]     Arrival date & time 09/19/13  1634 History  This chart was scribed for non-physician practitioner Elpidio Anis, PA-C working with Celene Kras, MD by Joaquin Music, ED Scribe. This patient was seen in room TR08C/TR08C and the patient's care was started at 5:03 PM .    Chief Complaint  Patient presents with  . Sore Throat   The history is provided by the patient. No language interpreter was used.   HPI Comments: Amy Santana is a 42 y.o. female who presents to the Emergency Department complaining of ongoing worsening sore throat that began 24 hours ago. She reports having difficult time swallowing. Pt states she has been having a subjective fever at home. She states she had slight blood in her sputum. Pt states she has been taking OTC ibuprofen without relief. She states her daughter has been sick recently but denies any other sick contacts. Pt denies hx of DM and asthma. Pt denies nausea and vomiting.  Past Medical History  Diagnosis Date  . No pertinent past medical history   . Uterine fibroid    Past Surgical History  Procedure Laterality Date  . Nasal polyp excision    . Wisdom tooth extraction     Family History  Problem Relation Age of Onset  . Hypertension Mother    History  Substance Use Topics  . Smoking status: Never Smoker   . Smokeless tobacco: Never Used  . Alcohol Use: No   OB History   Grav Para Term Preterm Abortions TAB SAB Ect Mult Living   3 3 3  0 0 0 0 0 0 3     Review of Systems  Constitutional: Positive for fever (subjective).  HENT: Positive for sore throat.   Gastrointestinal: Negative for nausea and vomiting.  All other systems reviewed and are negative.   Allergies  Review of patient's allergies indicates no known allergies.  Home Medications   Current Outpatient Rx  Name  Route  Sig  Dispense  Refill  . ibuprofen (ADVIL,MOTRIN) 600 MG tablet   Oral   Take 1 tablet (600 mg total) by mouth every 6  (six) hours as needed for pain.   30 tablet   5   . oxyCODONE-acetaminophen (PERCOCET/ROXICET) 5-325 MG per tablet   Oral   Take 1-2 tablets by mouth every 4 (four) hours as needed for pain.   40 tablet   0   . Prenatal Vit-Fe Fumarate-FA (PRENATAL MULTIVITAMIN) TABS tablet   Oral   Take 1 tablet by mouth daily at 12 noon.          Triage Vitals:BP 126/74  Pulse 80  Temp(Src) 98.7 F (37.1 C) (Oral)  Resp 18  Ht 5\' 1"  (1.549 m)  Wt 144 lb 8 oz (65.545 kg)  BMI 27.32 kg/m2  SpO2 97%  Physical Exam  Nursing note and vitals reviewed. Constitutional: She is oriented to person, place, and time. She appears well-developed and well-nourished. No distress.  HENT:  Head: Normocephalic and atraumatic.  Oropharynx red without exudate. Uvula is swallen but midline.  Eyes: EOM are normal.  Neck: Neck supple. No tracheal deviation present.  Cardiovascular: Normal rate, regular rhythm and normal heart sounds.   Pulmonary/Chest: Effort normal and breath sounds normal. No respiratory distress.  Musculoskeletal: Normal range of motion.  Neurological: She is alert and oriented to person, place, and time.  Skin: Skin is warm and dry.  Psychiatric: She has a normal mood and affect. Her behavior is  normal.    ED Course  Procedures DIAGNOSTIC STUDIES: Oxygen Saturation is 97% on RA, normal by my interpretation.    COORDINATION OF CARE: 5:08 PM-Discussed treatment plan which includes Rapid Strep test. Pt agreed to plan.   Labs Review Labs Reviewed - No data to display Imaging Review No results found.  EKG Interpretation   None      MDM  No diagnosis found. 1. Pharyngitis    I personally performed the services described in this documentation, which was scribed in my presence. The recorded information has been reviewed and is accurate.    Arnoldo Hooker, PA-C 09/19/13 1750

## 2013-09-19 NOTE — ED Notes (Signed)
Patient presents today with a chief complaint of sore throat x 3 days and fever today, patient reports pain is worse today and is experiencing painful swallowing. Patient reports taking ibuprofen yesterday without relief, currently breastfeeding.

## 2013-09-19 NOTE — ED Provider Notes (Signed)
Medical screening examination/treatment/procedure(s) were performed by non-physician practitioner and as supervising physician I was immediately available for consultation/collaboration.    Torryn Hudspeth R Kla Bily, MD 09/19/13 2005 

## 2013-09-21 LAB — CULTURE, GROUP A STREP

## 2013-09-22 ENCOUNTER — Telehealth (HOSPITAL_COMMUNITY): Payer: Self-pay | Admitting: Emergency Medicine

## 2013-09-22 NOTE — ED Notes (Signed)
Post ED Visit - Positive Culture Follow-up: Successful Patient Follow-Up  Culture assessed and recommendations reviewed by: []  Wes Dulaney, Pharm.D., BCPS []  Celedonio Miyamoto, [address].D., BCPS []  Georgina Pillion, Pharm.D., BCPS []  [place], [address].D., BCPS, AAHIVP [x]  Estella Husk, Pharm.D., BCPS, AAHIVP  Positive strep culture  [x]  Patient discharged without antimicrobial prescription and treatment is now indicated []  Organism is resistant to prescribed ED discharge antimicrobial []  Patient with positive blood cultures  Changes discussed with ED provider: Marlon Pel PA-C New antibiotic prescription: Penicillin VK 500 mg PO BID x 10 days    Zeb Comfort 09/22/2013, 12:23 PM

## 2013-09-22 NOTE — Progress Notes (Signed)
ED Antimicrobial Stewardship Positive Culture Follow Up   Amy Santana is an 42 y.o. female who presented to [facility] on 09/19/2013 with a chief complaint of  Chief Complaint  Patient presents with  . Sore Throat    Recent Results (from the past 720 hour(s))  RAPID STREP SCREEN     Status: None   Collection Time    09/19/13  5:08 PM      Result Value Range Status   Streptococcus, Group A Screen (Direct) NEGATIVE  NEGATIVE Final   Comment: (NOTE)     A Rapid Antigen test may result negative if the antigen level in the     sample is below the detection level of this test. The FDA has not     cleared this test as a stand-alone test therefore the rapid antigen     negative result has reflexed to a Group A Strep culture.  CULTURE, GROUP A STREP     Status: None   Collection Time    09/19/13  5:08 PM      Result Value Range Status   Specimen Description THROAT   Final   Special Requests NONE   Final   Culture     Final   Value: GROUP A STREP (S.PYOGENES) ISOLATED     Performed at [facility]   Report Status 09/21/2013 FINAL   Final    [x]  Patient discharged originally without antimicrobial agent and treatment is now indicated  New antibiotic prescription: Penicillin VK 500mg  PO BID x 10 days  ED Provider: Marlon Pel, Alroy Bailiff 09/22/2013, 11:36 AM Infectious Diseases Pharmacist Phone# [phone]

## 2013-09-26 NOTE — ED Notes (Signed)
Unable to contact patient via phone. Sent letter. °

## 2013-10-02 NOTE — ED Notes (Signed)
Patient called and informed of results by Margot Chimes PFM and of need for abx.Rx called to  [facility] [phone]

## 2013-10-03 ENCOUNTER — Ambulatory Visit: Payer: Medicaid Other | Admitting: Obstetrics & Gynecology

## 2013-10-10 ENCOUNTER — Encounter: Payer: Self-pay | Admitting: Obstetrics & Gynecology

## 2013-10-10 ENCOUNTER — Ambulatory Visit (INDEPENDENT_AMBULATORY_CARE_PROVIDER_SITE_OTHER): Payer: Medicaid Other | Admitting: Obstetrics & Gynecology

## 2013-10-10 VITALS — BP 120/79 | HR 68 | Temp 96.5°F | Ht 61.0 in | Wt 141.0 lb

## 2013-10-10 DIAGNOSIS — Z30430 Encounter for insertion of intrauterine contraceptive device: Secondary | ICD-10-CM

## 2013-10-10 LAB — POCT URINE PREGNANCY: Preg Test, Ur: NEGATIVE

## 2013-10-10 MED ORDER — LEVONORGESTREL 20 MCG/24HR IU IUD: INTRAUTERINE_SYSTEM | Freq: Once | INTRAUTERINE | Status: DC

## 2013-10-10 NOTE — Progress Notes (Signed)
Pt not seen.

## 2013-10-17 ENCOUNTER — Encounter: Payer: Self-pay | Admitting: Obstetrics & Gynecology

## 2013-10-17 ENCOUNTER — Ambulatory Visit (INDEPENDENT_AMBULATORY_CARE_PROVIDER_SITE_OTHER): Payer: Medicaid Other | Admitting: Obstetrics & Gynecology

## 2013-10-17 DIAGNOSIS — Z113 Encounter for screening for infections with a predominantly sexual mode of transmission: Secondary | ICD-10-CM

## 2013-10-17 NOTE — Patient Instructions (Addendum)
Levonorgestrel intrauterine device (IUD) What is this medicine? LEVONORGESTREL IUD (LEE voe nor jes trel) is a contraceptive (birth control) device. The device is placed inside the uterus by a healthcare professional. It is used to prevent pregnancy and can also be used to treat heavy bleeding that occurs during your period. Depending on the device, it can be used for 3 to 5 years. This medicine may be used for other purposes; ask your health care provider or pharmacist if you have questions. COMMON BRAND NAME(S): Mirena, Skyla What should I tell my health care provider before I take this medicine? They need to know if you have any of these conditions: -abnormal Pap smear -cancer of the breast, uterus, or cervix -diabetes -endometritis -genital or pelvic infection now or in the past -have more than one sexual partner or your partner has more than one partner -heart disease -history of an ectopic or tubal pregnancy -immune system problems -IUD in place -liver disease or tumor -problems with blood clots or take blood-thinners -use intravenous drugs -uterus of unusual shape -vaginal bleeding that has not been explained -an unusual or allergic reaction to levonorgestrel, other hormones, silicone, or polyethylene, medicines, foods, dyes, or preservatives -pregnant or trying to get pregnant -breast-feeding How should I use this medicine? This device is placed inside the uterus by a health care professional. Talk to your pediatrician regarding the use of this medicine in children. Special care may be needed. Overdosage: If you think you have taken too much of this medicine contact a poison control center or emergency room at once. NOTE: This medicine is only for you. Do not share this medicine with others. What if I miss a dose? This does not apply. What may interact with this medicine? Do not take this medicine with any of the following  medications: -amprenavir -bosentan -fosamprenavir This medicine may also interact with the following medications: -aprepitant -barbiturate medicines for inducing sleep or treating seizures -bexarotene -griseofulvin -medicines to treat seizures like carbamazepine, ethotoin, felbamate, oxcarbazepine, phenytoin, topiramate -modafinil -pioglitazone -rifabutin -rifampin -rifapentine -some medicines to treat HIV infection like atazanavir, indinavir, lopinavir, nelfinavir, tipranavir, ritonavir -St. John's wort -warfarin This list may not describe all possible interactions. Give your health care provider a list of all the medicines, herbs, non-prescription drugs, or dietary supplements you use. Also tell them if you smoke, drink alcohol, or use illegal drugs. Some items may interact with your medicine. What should I watch for while using this medicine? Visit your doctor or health care professional for regular check ups. See your doctor if you or your partner has sexual contact with others, becomes HIV positive, or gets a sexual transmitted disease. This product does not protect you against HIV infection (AIDS) or other sexually transmitted diseases. You can check the placement of the IUD yourself by reaching up to the top of your vagina with clean fingers to feel the threads. Do not pull on the threads. It is a good habit to check placement after each menstrual period. Call your doctor right away if you feel more of the IUD than just the threads or if you cannot feel the threads at all. The IUD may come out by itself. You may become pregnant if the device comes out. If you notice that the IUD has come out use a backup birth control method like condoms and call your health care provider. Using tampons will not change the position of the IUD and are okay to use during your period. What side effects may I   notice from receiving this medicine? Side effects that you should report to your doctor or  health care professional as soon as possible: -allergic reactions like skin rash, itching or hives, swelling of the face, lips, or tongue -fever, flu-like symptoms -genital sores -high blood pressure -no menstrual period for 6 weeks during use -pain, swelling, warmth in the leg -pelvic pain or tenderness -severe or sudden headache -signs of pregnancy -stomach cramping -sudden shortness of breath -trouble with balance, talking, or walking -unusual vaginal bleeding, discharge -yellowing of the eyes or skin Side effects that usually do not require medical attention (report to your doctor or health care professional if they continue or are bothersome): -acne -breast pain -change in sex drive or performance -changes in weight -cramping, dizziness, or faintness while the device is being inserted -headache -irregular menstrual bleeding within first 3 to 6 months of use -nausea This list may not describe all possible side effects. Call your doctor for medical advice about side effects. You may report side effects to FDA at 1-800-FDA-1088. Where should I keep my medicine? This does not apply. NOTE: This sheet is a summary. It may not cover all possible information. If you have questions about this medicine, talk to your doctor, pharmacist, or health care provider.  2014, Elsevier/Gold Standard. (2011-10-13 13:54:04) Varicella Virus Vaccine Live injection What is this medicine? VARICELLA VIRUS VACCINE (var uh SEL uh VAHY ruhs vak SEEN) is used to prevent infections of chickenpox. HERPES ZOSTER VIRUS VACCINE (HUR peez ZOS ter vahy ruhs vak SEEN) is used to prevent shingles in adults 42 years old and over. This vaccine is not used to treat shingles or nerve pain from shingles. These medicines may be used for other purposes; ask your health care provider or pharmacist if you have questions. This medicine may be used for other purposes; ask your health care provider or pharmacist if you have  questions. COMMON BRAND NAME(S): Varivax What should I tell my health care provider before I take this medicine? They need to know if you have any of the following conditions: -blood disorders or disease -cancer like leukemia or lymphoma -immune system problems or therapy -infection with fever -recent immune globulin therapy -tuberculosis -an unusual or allergic reaction to vaccines, neomycin, gelatin, other medicines, foods, dyes, or preservatives -pregnant or trying to get pregnant -breast-feeding How should I use this medicine? These vaccines are for injection under the skin. They are given by a health care professional. A copy of Vaccine Information Statements will be given before each varicella virus vaccination. Read this sheet carefully each time. The sheet may change frequently. A Vaccine Information Statement is not given before the herpes zoster virus vaccine. Talk to your pediatrician regarding the use of the varicella virus vaccine in children. While this drug may be prescribed for children as young as 23 months of age for selected conditions, precautions do apply. The herpes zoster virus vaccine is not approved in children. Overdosage: If you think you have taken too much of this medicine contact a poison control center or emergency room at once. NOTE: This medicine is only for you. Do not share this medicine with others. What if I miss a dose? Keep appointments for follow-up (booster) doses of varicella virus vaccine as directed. It is important not to miss your dose. Call your doctor or health care professional if you are unable to keep an appointment. Follow-up (booster) doses are not needed for the herpes zoster virus vaccine. What may interact with this medicine? Do  not take these medicines with any of the following medications: -adalimumab -anakinra -etanercept -infliximab -medicines that suppress your immune system -medicines to treat cancer These medicines may also  interact with the following medications: -aspirin and aspirin-like medicines (varicella virus vaccine only) -blood transfusions (varicella virus vaccine only) -immunoglobulins (varicella virus vaccine only) -steroid medicines like prednisone or cortisone This list may not describe all possible interactions. Give your health care provider a list of all the medicines, herbs, non-prescription drugs, or dietary supplements you use. Also tell them if you smoke, drink alcohol, or use illegal drugs. Some items may interact with your medicine. What should I watch for while using this medicine? Visit your doctor for regular check ups. These vaccines, like all vaccines, may not fully protect everyone. After receiving these vaccines it may be possible to pass chickenpox infection to others. For up to 6 weeks, avoid people with immune system problems, pregnant women who have not had chickenpox, newborns of women who have not had chickenpox, and all newborns born at less than 28 weeks of pregnancy. Talk to your doctor for more information. Do not become pregnant for 3 months after taking these vaccines. Women should inform their doctor if they wish to become pregnant or think they might be pregnant. There is a potential for serious side effects to an unborn child. Talk to your health care professional or pharmacist for more information. What side effects may I notice from receiving this medicine? Side effects that you should report to your doctor or health care professional as soon as possible: -allergic reactions like skin rash, itching or hives, swelling of the face, lips, or tongue -breathing problems -extreme changes in behavior -feeling faint or lightheaded, falls -fever over 102 degrees F -pain, tingling, numbness in the hands or feet -redness, blistering, peeling or loosening of the skin, including inside the mouth -seizures -unusually weak or tired Side effects that usually do not require medical  attention (report to your doctor or health care professional if they continue or are bothersome): -aches or pains -chickenpox-like rash -diarrhea -headache -low-grade fever under 102 degrees F -loss of appetite -nausea, vomiting -redness, pain, swelling at site where injected -sleepy -trouble sleeping This list may not describe all possible side effects. Call your doctor for medical advice about side effects. You may report side effects to FDA at 1-800-FDA-1088. Where should I keep my medicine? These drugs are given in a hospital or clinic and will not be stored at home. NOTE: This sheet is a summary. It may not cover all possible information. If you have questions about this medicine, talk to your doctor, pharmacist, or health care provider.  2014, Elsevier/Gold Standard. (2013-05-17 14:24:35)

## 2013-10-17 NOTE — Progress Notes (Signed)
Subjective:     Amy Santana is a 43 y.o. female who presents for a postpartum visit. She is 3 months postpartum following a spontaneous vaginal delivery. I have fully reviewed the prenatal and intrapartum course. The delivery was at 39.4 gestational weeks. Outcome: spontaneous vaginal delivery. Anesthesia: none. Postpartum course has been WNL. Baby's course has been WNL. Baby is feeding by breast. Bleeding no bleeding. Bowel function is normal. Bladder function is normal. Patient is sexually active. Contraception method is condoms. Postpartum depression screening: negative.  The following portions of the patient's history were reviewed and updated as appropriate: allergies, current medications, past family history, past medical history, past social history, past surgical history and problem list.  Review of Systems Pertinent items are noted in HPI.   Objective:    BP 126/74  Pulse 76  Temp(Src) 98.5 F (36.9 C)  Ht 5\' 1"  (1.549 m)  Wt 142 lb (64.411 kg)  BMI 26.84 kg/m2  Breastfeeding? Yes        General:  alert     Abdomen: soft, non-tender; bowel sounds normal; no masses,  no organomegaly   Vulva:  normal  Vagina: normal vagina  Cervix:  no lesions  Corpus: normal size, contour, position, consistency, mobility, non-tender  Adnexa:  Right-sided adnexal mass, mobile, solid, NT--likely uterine origin   Assessment:    Small fibroid uterus  Varicella non-immune  Plan:    1. Contraception consider IUD 2. Follow-up as needed.  3.  Encouraged to have varicella vaccine administered at the health dept.

## 2013-10-17 NOTE — Addendum Note (Signed)
Addended by: Jiles Garter on: 10/17/2013 04:34 PM   Modules accepted: Orders

## 2013-10-18 ENCOUNTER — Encounter: Payer: Self-pay | Admitting: Obstetrics & Gynecology

## 2013-10-18 LAB — GC/CHLAMYDIA PROBE AMP
CT PROBE, AMP APTIMA: NEGATIVE
GC Probe RNA: NEGATIVE

## 2013-10-24 ENCOUNTER — Ambulatory Visit: Payer: Medicaid Other | Admitting: Obstetrics & Gynecology

## 2013-10-31 ENCOUNTER — Ambulatory Visit: Payer: Medicaid Other | Admitting: Obstetrics & Gynecology

## 2013-11-07 ENCOUNTER — Encounter: Payer: Self-pay | Admitting: Obstetrics & Gynecology

## 2013-11-07 ENCOUNTER — Ambulatory Visit (INDEPENDENT_AMBULATORY_CARE_PROVIDER_SITE_OTHER): Payer: Medicaid Other | Admitting: Obstetrics & Gynecology

## 2013-11-07 VITALS — BP 136/89 | HR 76 | Temp 97.7°F | Wt 144.0 lb

## 2013-11-07 DIAGNOSIS — Z30430 Encounter for insertion of intrauterine contraceptive device: Secondary | ICD-10-CM

## 2013-11-07 DIAGNOSIS — Z3202 Encounter for pregnancy test, result negative: Secondary | ICD-10-CM

## 2013-11-07 DIAGNOSIS — Z01818 Encounter for other preprocedural examination: Secondary | ICD-10-CM

## 2013-11-07 LAB — POCT URINE PREGNANCY: Preg Test, Ur: NEGATIVE

## 2013-11-07 NOTE — Patient Instructions (Signed)

## 2013-11-07 NOTE — Progress Notes (Signed)
  IUD Insertion Procedure Note  Pt states that she has been sexually active this past week with use of condom for protection.  Pt is currently not bleeding and is breastfeeding.  Pre-operative Diagnosis: Desires IUD for contraception  Post-operative Diagnosis: same  Indications: contraception  Procedure Details  Urine pregnancy test was done today and result was negative.  The risks (including infection, bleeding, pain, and uterine perforation) and benefits of the procedure were explained to the patient and Written informed consent was obtained.    Cervix cleansed with Betadine. Uterus sounded to 7 cm. IUD inserted without difficulty. String visible and trimmed. Patient tolerated procedure well.  IUD Information: Mirena.  Condition: Stable  Complications: None  Plan:  The patient was advised to call for any fever or for prolonged or severe pain or bleeding. She was advised to use OTC analgesics as needed for mild to moderate pain.

## 2013-11-08 ENCOUNTER — Encounter: Payer: Self-pay | Admitting: Obstetrics & Gynecology

## 2013-12-19 ENCOUNTER — Ambulatory Visit (INDEPENDENT_AMBULATORY_CARE_PROVIDER_SITE_OTHER): Payer: Medicaid Other | Admitting: Obstetrics & Gynecology

## 2013-12-19 ENCOUNTER — Ambulatory Visit: Payer: Medicaid Other | Admitting: Obstetrics & Gynecology

## 2013-12-19 ENCOUNTER — Encounter: Payer: Self-pay | Admitting: Obstetrics & Gynecology

## 2013-12-19 VITALS — BP 105/72 | HR 81 | Temp 98.3°F | Ht 61.0 in | Wt 145.0 lb

## 2013-12-19 DIAGNOSIS — Z30431 Encounter for routine checking of intrauterine contraceptive device: Secondary | ICD-10-CM

## 2013-12-19 NOTE — Progress Notes (Signed)
Subjective:     Amy Santana is a 43 y.o. female here for a routine exam.  Current complaints: Patient is in the office today for a follow up visit for Mirena Insertion. Patient states she has been having spotting. Patient states her spotting started on February 26th and was a very light flow but that she is still having spotting now of and on. Personal health questionnaire reviewed: yes.   Gynecologic History Patient's last menstrual period was 11/21/2013. Contraception: condoms and IUD Last Pap: 01/03/2013. Results were: normal  Obstetric History OB History  Gravida Para Term Preterm AB SAB TAB Ectopic Multiple Living  3 3 3  0 0 0 0 0 0 3    # Outcome Date GA Lbr Len/2nd Weight Sex Delivery Anes PTL Lv  3 TRM 07/09/13 [redacted]w[redacted]d / 00:02 8 lb 4.6 oz (3.76 kg) M SVD None  Y     Comments: None  2 TRM 10/11/10 [redacted]w[redacted]d  7 lb 5 oz (3.317 kg) M SVD None  Y  1 TRM 11/15/07 [redacted]w[redacted]d  7 lb 8 oz (3.402 kg) F SVD EPI  Y       The following portions of the patient's history were reviewed and updated as appropriate: allergies, current medications, past family history, past medical history, past social history, past surgical history and problem list.  Review of Systems Pertinent items are noted in HPI.    Objective:     SPEC: IUD strings seen Bimanual: Uterus NT     Assessment:    Normal exam s/p IUD insertion  Plan:     Return prn

## 2014-01-22 ENCOUNTER — Other Ambulatory Visit: Payer: Self-pay | Admitting: *Deleted

## 2014-01-22 DIAGNOSIS — IMO0001 Reserved for inherently not codable concepts without codable children: Secondary | ICD-10-CM

## 2014-01-22 MED ORDER — PRENATAL MULTIVITAMIN CH: 1.0000 | ORAL_TABLET | Freq: Every day | ORAL | Status: DC

## 2014-02-18 ENCOUNTER — Telehealth: Payer: Self-pay | Admitting: *Deleted

## 2014-02-18 NOTE — Telephone Encounter (Signed)
Refill request faxed in via Pharmacy.  Prednisone 10mg  tablets, Rx written 06/05/2013.  Please advise on Rx refill request sent by pharmacy.

## 2014-07-28 ENCOUNTER — Encounter: Payer: Self-pay | Admitting: Obstetrics & Gynecology

## 2014-08-19 IMAGING — US US OB DETAIL+14 WK
1 series · 12 of 28 positions shown · non-contrast
Comparison: none

[Series 1: us ob detail+14 wk · 0.19mm/px · 12 of 76 slices shown]
[im 3/76]
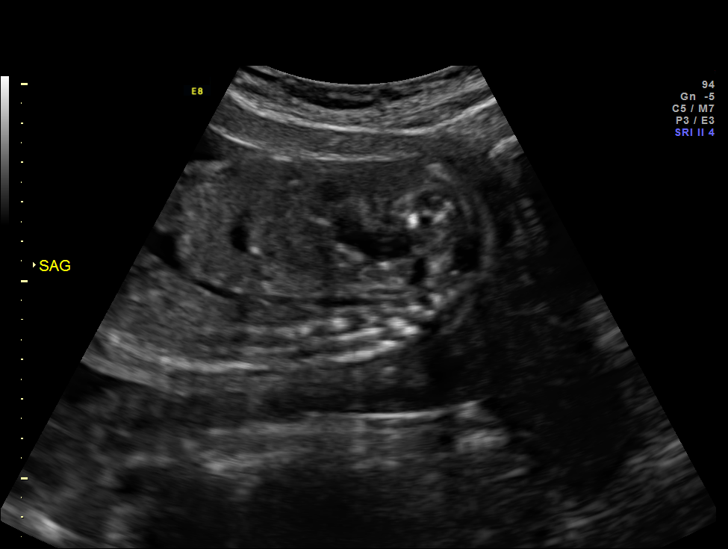
[im 9/76]
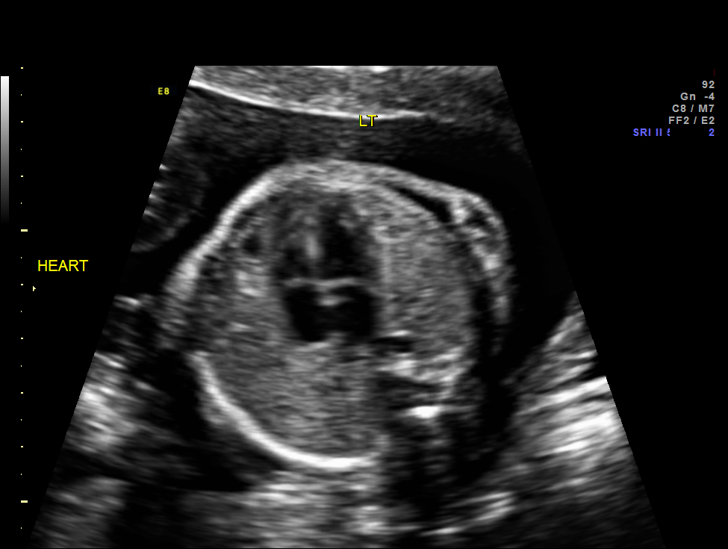
[im 14/76]
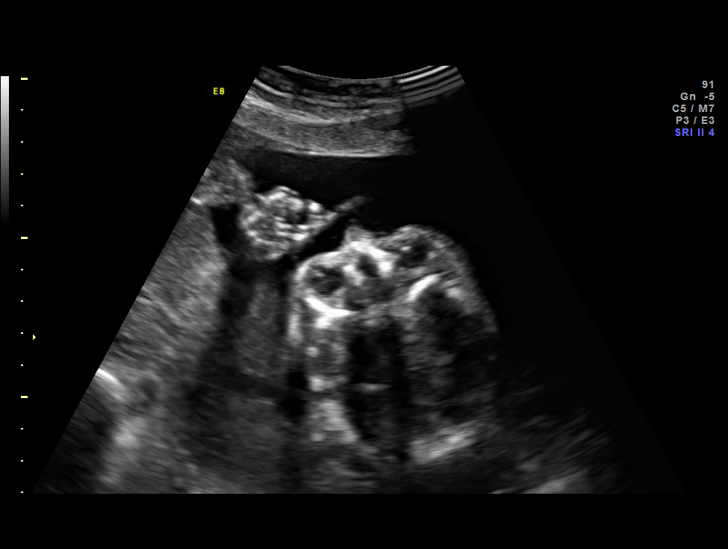
[im 23/76]
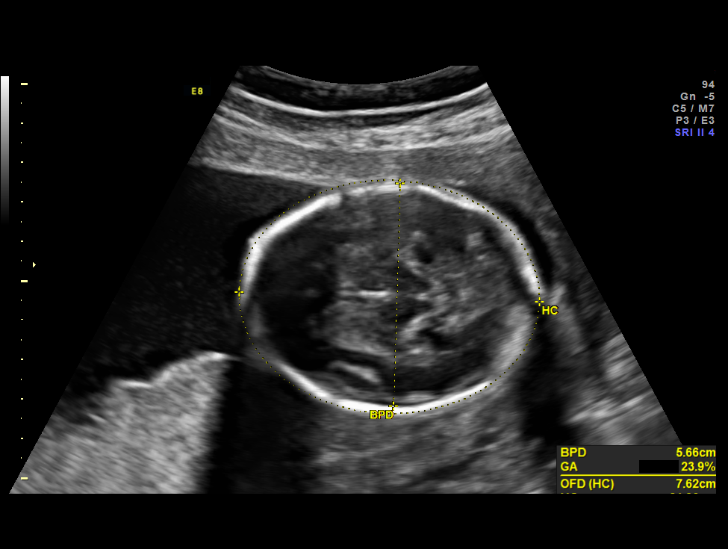
[im 28/76]
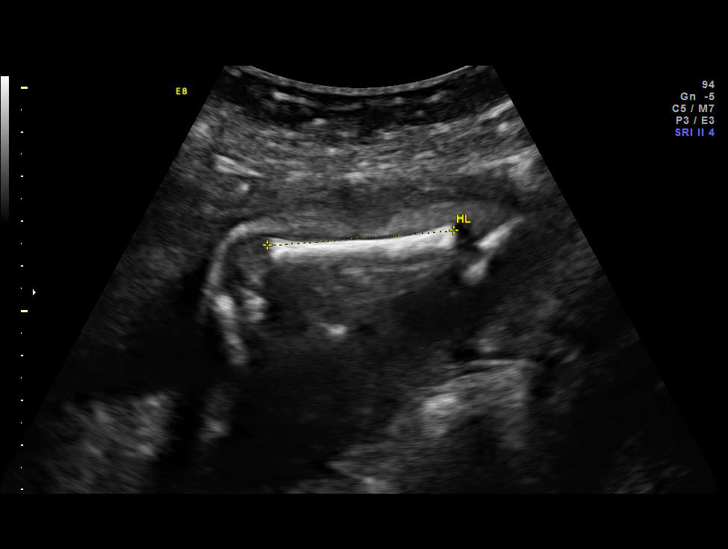
[im 34/76]
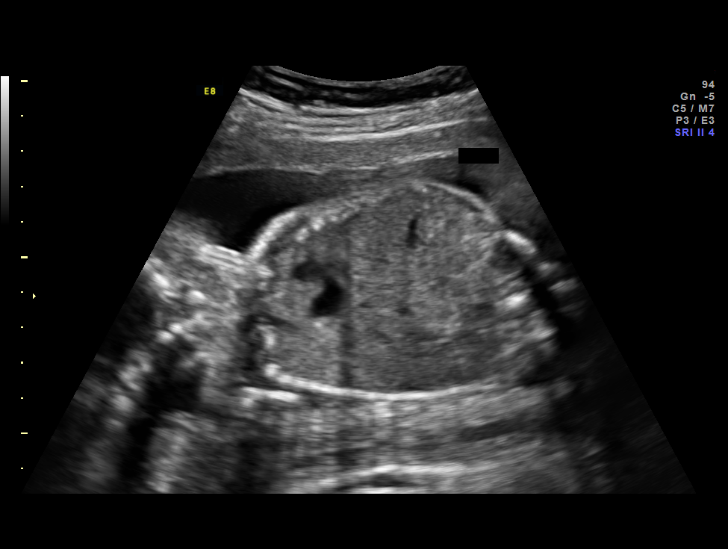
[im 42/76]
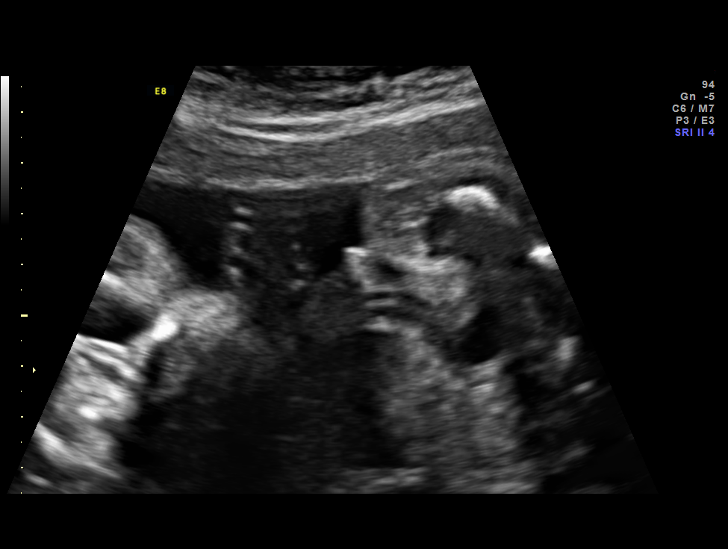
[im 48/76]
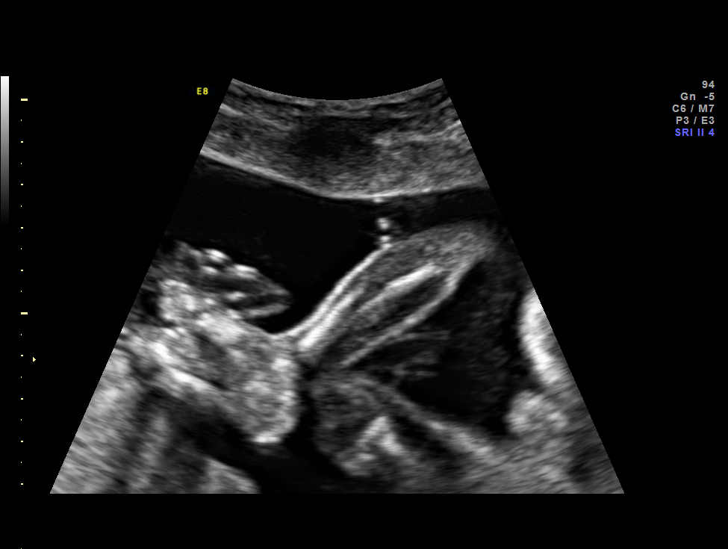
[im 53/76]
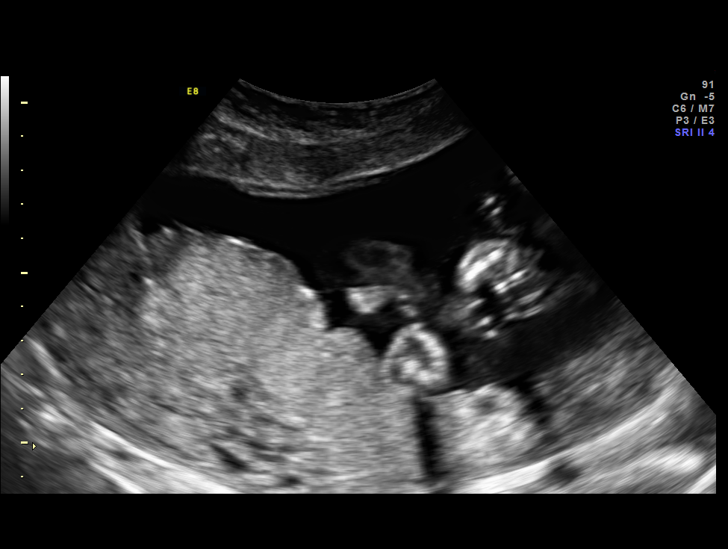
[im 62/76]
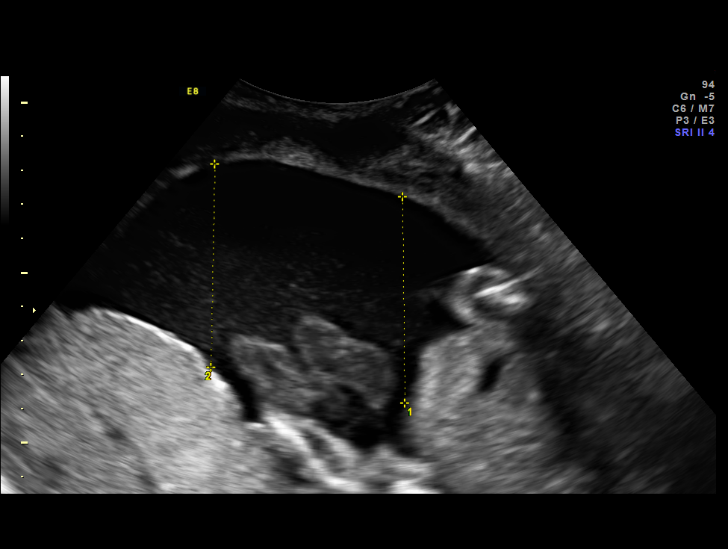
[im 67/76]
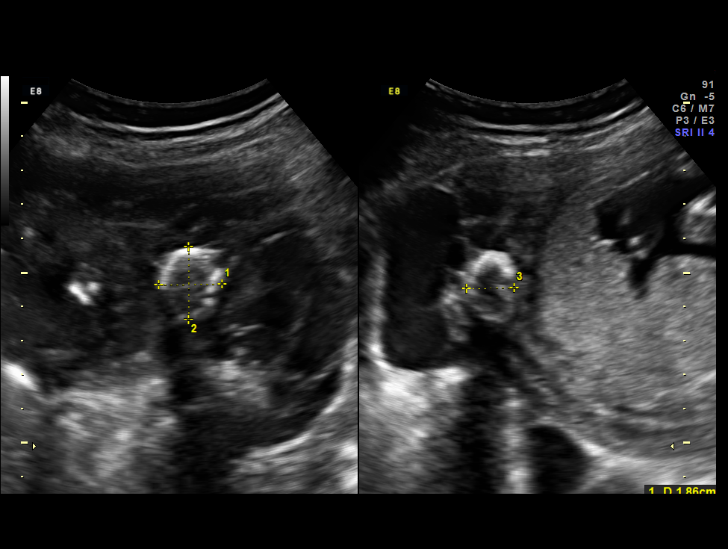
[im 73/76]
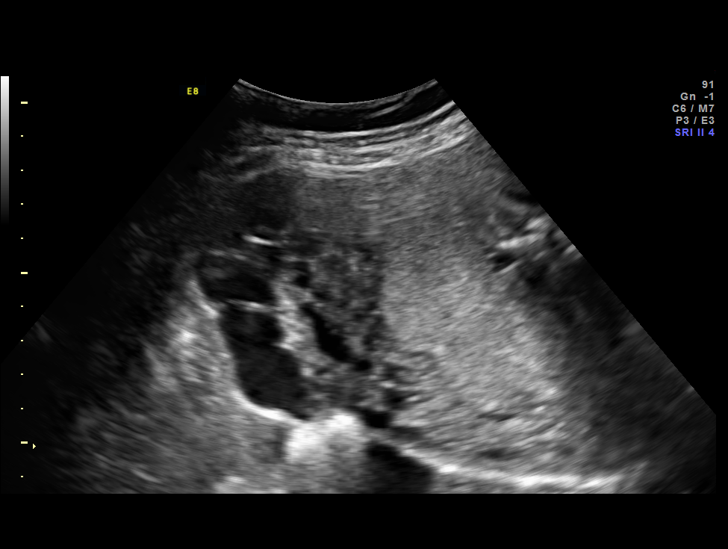

[12 of 28 positions shown; findings below may reference images not displayed]

OBSTETRICS REPORT
                      (Signed Final 03/22/2013 [DATE])

             AGYAPOMAH

Service(s) Provided

 US OB DETAIL + 14 WK                                  76811.0
Indications

 Advanced maternal age (AMA), Multigravida
 Detailed fetal anatomic survey
 Uterine fibroids
Fetal Evaluation

 Num Of Fetuses:    1
 Fetal Heart Rate:  142                          bpm
 Cardiac Activity:  Observed
 Presentation:      Breech
 Placenta:          Right lateral, above
                    cervical os
 P. Cord            Visualized, central
 Insertion:

 Amniotic Fluid
 AFI FV:      Subjectively within normal limits
                                             Larg Pckt:     6.0  cm
Biometry

 BPD:     57.1  mm     G. Age:  23w 3d                CI:         74.8   70 - 86
 OFD:     76.3  mm                                    FL/HC:      20.0   18.7 -

 HC:     214.4  mm     G. Age:  23w 4d       21  %    HC/AC:      1.04   1.05 -

 AC:     206.7  mm     G. Age:  25w 2d       81  %    FL/BPD:     75.1   71 - 87
 FL:      42.9  mm     G. Age:  24w 0d       43  %    FL/AC:      20.8   20 - 24
 HUM:     41.1  mm     G. Age:  25w 0d       66  %

 Est. FW:     707  gm      1 lb 9 oz     64  %
Gestational Age

 LMP:           21w 4d        Date:  10/22/12                 EDD:   07/29/13
 U/S Today:     24w 1d                                        EDD:   07/11/13
 Best:          23w 6d     Det. By:  Early Ultrasound         EDD:   07/13/13
                                     (11/18/12)
Anatomy

 Cranium:          Appears normal         Aortic Arch:      Appears normal
 Fetal Cavum:      Appears normal         Ductal Arch:      Appears normal
 Ventricles:       Appears normal         Diaphragm:        Appears normal
 Choroid Plexus:   Appears normal         Stomach:          Appears normal
 Cerebellum:       Appears normal         Abdomen:          Appears normal
 Posterior Fossa:  Appears normal         Abdominal Wall:   Appears nml (cord
                                                            insert, abd wall)
 Nuchal Fold:      Appears normal         Cord Vessels:     Appears normal (3
                                                            vessel cord)
 Face:             Appears normal         Kidneys:          Appear normal
                   (orbits and profile)
 Lips:             Appears normal         Bladder:          Appears normal
 Heart:            Appears normal         Spine:            Appears normal
                   (4CH, axis, and
                   situs)
 RVOT:             Appears normal         Lower             Appears normal
                                          Extremities:
 LVOT:             Appears normal         Upper             Appears normal
                                          Extremities:

 Other:  Heels and 5th digit appear normal. Fetus appears to be a male.
Cervix Uterus Adnexa

 Cervical Length:    5.7      cm

 Cervix:       Normal appearance by transabdominal scan.
 Uterus:       Multiple fibroids noted, see table below.
 Cul De Sac:   No free fluid seen.
 Left Ovary:    Not visualized.
 Right Ovary:   Not visualized.
 Adnexa:     No abnormality visualized.
Myomas

 Site                     L(cm)      W(cm)       D(cm)      Location
 LUS Right
 LUS/Mid Right
 Fundus Right
 Anterior Mid             1.2        1.4         1

 Blood Flow                  RI       PI       Comments

Impression

 Single IUP at 23 [DATE] weeks
 Normal fetal anatomic survey
 Fetal growth is appropriate (64th %tile)
 Multiple calcified uterine myomas noted (see above)
 Normal amniotic fluid volume

 Patient declined further genetic screening due to advanced
 maternal age.
Recommendations

 Recommend follow up in 4 weeks for interval growth.
 Recommend antepartum fetal testing beginning at 36 weeks
 due to advanced maternal age > 40

## 2014-09-22 ENCOUNTER — Encounter: Payer: Self-pay | Admitting: *Deleted

## 2014-09-23 ENCOUNTER — Encounter: Payer: Self-pay | Admitting: Obstetrics & Gynecology

## 2014-11-18 ENCOUNTER — Encounter (HOSPITAL_COMMUNITY): Payer: Self-pay | Admitting: *Deleted

## 2014-11-18 ENCOUNTER — Inpatient Hospital Stay (HOSPITAL_COMMUNITY)
Admission: AD | Admit: 2014-11-18 | Discharge: 2014-11-18 | Disposition: A | Discharge status: Home / Self Care | Payer: Medicaid Other | Source: Ambulatory Visit | Attending: Obstetrics | Admitting: Obstetrics

## 2014-11-18 DIAGNOSIS — L293 Anogenital pruritus, unspecified: Secondary | ICD-10-CM | POA: Diagnosis present

## 2014-11-18 DIAGNOSIS — N760 Acute vaginitis: Secondary | ICD-10-CM | POA: Insufficient documentation

## 2014-11-18 LAB — URINALYSIS, ROUTINE W REFLEX MICROSCOPIC
Bilirubin Urine: NEGATIVE
GLUCOSE, UA: NEGATIVE mg/dL
Ketones, ur: NEGATIVE mg/dL
Nitrite: NEGATIVE
PROTEIN: NEGATIVE mg/dL
Specific Gravity, Urine: 1.02 (ref 1.005–1.030)
UROBILINOGEN UA: 0.2 mg/dL (ref 0.0–1.0)
pH: 7 (ref 5.0–8.0)

## 2014-11-18 LAB — POCT PREGNANCY, URINE: Preg Test, Ur: NEGATIVE

## 2014-11-18 LAB — URINE MICROSCOPIC-ADD ON

## 2014-11-18 LAB — WET PREP, GENITAL
Trich, Wet Prep: NONE SEEN
YEAST WET PREP: NONE SEEN

## 2014-11-18 MED ORDER — FLUCONAZOLE 150 MG PO TABS: 150.0000 mg | ORAL_TABLET | Freq: Every day | ORAL | Status: DC

## 2014-11-18 MED ORDER — NYSTATIN-TRIAMCINOLONE 100000-0.1 UNIT/GM-% EX OINT: 1.0000 "application " | TOPICAL_OINTMENT | Freq: Two times a day (BID) | CUTANEOUS | Status: DC

## 2014-11-18 NOTE — MAU Provider Note (Signed)
History     CSN: [id number]  Arrival date and time: 11/18/14 1546   First Provider Initiated Contact with Patient 11/18/14 1917      Chief Complaint  Patient presents with  . Vaginal Itching   HPI Cleaster Rosamilia 44 y.o. [phone] nonpregnant female presents to MAU complaining of vaginal itching.  It started 3 days ago and has worsened during that time.  No vaginal discharge or bleeding.  Burning is present upon urination.  She used vagisil OTC which provided temporary relief.  She denies nausea, vomiting, fever, weakness.  OB History    Gravida Para Term Preterm AB TAB SAB Ectopic Multiple Living   3 3 3  0 0 0 0 0 0 3      Past Medical History  Diagnosis Date  . No pertinent past medical history   . Uterine fibroid     Past Surgical History  Procedure Laterality Date  . Nasal polyp excision    . Wisdom tooth extraction      Family History  Problem Relation Age of Onset  . Hypertension Mother     History  Substance Use Topics  . Smoking status: Never Smoker   . Smokeless tobacco: Never Used  . Alcohol Use: No    Allergies: No Known Allergies  Prescriptions prior to admission  Medication Sig Dispense Refill Last Dose  . ibuprofen (ADVIL,MOTRIN) 600 MG tablet Take 1 tablet (600 mg total) by mouth every 6 (six) hours as needed for pain. 30 tablet 5 Taking  . Prenatal Vit-Fe Fumarate-FA (PRENATAL MULTIVITAMIN) TABS tablet Take 1 tablet by mouth daily at 12 noon. 30 tablet 11     ROS Pertinent ROS in HPI  Physical Exam   Blood pressure 131/75, pulse 76, temperature 98.6 F (37 C), temperature source Oral, resp. rate 17, height 5\' 1"  (1.549 m), weight 141 lb (63.957 kg), last menstrual period 10/20/2014, SpO2 100 %, currently breastfeeding.  Physical Exam  Constitutional: She is oriented to person, place, and time. She appears well-developed and well-nourished.  HENT:  Head: Normocephalic and atraumatic.  Eyes: EOM are normal.  Neck: Normal range of  motion.  Cardiovascular: Normal rate, regular rhythm and normal heart sounds.   Respiratory: Effort normal and breath sounds normal. No respiratory distress.  GI: Soft. Bowel sounds are normal. She exhibits no distension. There is no tenderness. There is no rebound and no guarding.  Genitourinary:  Labia with bilateral clusters tiny bumps or finger-like projections.  Some of these areas are covered with a whitish adherent film.  These areas are tender to palpation and very itchy.   Vagina with large amt of yellowish white homogenous discharge.  Cervix is pink and smooth.  NO CMT.  No adnexal mass or tenderness.  Musculoskeletal: Normal range of motion.  Neurological: She is alert and oriented to person, place, and time.  Skin: Skin is warm and dry.  Psychiatric: She has a normal mood and affect.   Results for orders placed or performed during the hospital encounter of 11/18/14 (from the past 24 hour(s))  Pregnancy, urine POC     Status: None   Collection Time: 11/18/14  4:51 PM  Result Value Ref Range   Preg Test, Ur NEGATIVE NEGATIVE  Urinalysis, Routine w reflex microscopic     Status: Abnormal   Collection Time: 11/18/14  4:58 PM  Result Value Ref Range   Color, Urine YELLOW YELLOW   APPearance CLEAR CLEAR   Specific Gravity, Urine 1.020 1.005 - 1.030  pH 7.0 5.0 - 8.0   Glucose, UA NEGATIVE NEGATIVE mg/dL   Hgb urine dipstick MODERATE (A) NEGATIVE   Bilirubin Urine NEGATIVE NEGATIVE   Ketones, ur NEGATIVE NEGATIVE mg/dL   Protein, ur NEGATIVE NEGATIVE mg/dL   Urobilinogen, UA 0.2 0.0 - 1.0 mg/dL   Nitrite NEGATIVE NEGATIVE   Leukocytes, UA MODERATE (A) NEGATIVE  Urine microscopic-add on     Status: Abnormal   Collection Time: 11/18/14  4:58 PM  Result Value Ref Range   Squamous Epithelial / LPF FEW (A) RARE   WBC, UA 3-6 <3 WBC/hpf   RBC / HPF 3-6 <3 RBC/hpf   Bacteria, UA FEW (A) RARE   Urine-Other MUCOUS PRESENT   Wet prep, genital     Status: Abnormal   Collection  Time: 11/18/14  7:30 PM  Result Value Ref Range   Yeast Wet Prep HPF POC NONE SEEN NONE SEEN   Trich, Wet Prep NONE SEEN NONE SEEN   Clue Cells Wet Prep HPF POC FEW (A) NONE SEEN   WBC, Wet Prep HPF POC FEW (A) NONE SEEN    MAU Course  Procedures  MDM Clinical exam + HPI + wet prep   Assessment and Plan  A:  1. Vaginitis and vulvovaginitis    P: Discharge to home Diflucan x 1 dose Nystatin/Triamcinolone OTC Tylenol/Ice for discomfort F/u with Dr. Jodi Mourning in clinic for further eval/treatment.   Patient may return to MAU as needed or if her condition were to change or worsen   Paticia Stack 11/18/2014, 7:18 PM

## 2014-11-18 NOTE — MAU Note (Signed)
Pt states "I a lot of itching in my  Vagina and when I pee I have a burning sensation" Pt states she noticed this yesterday and today it is really bad

## 2014-11-18 NOTE — MAU Note (Signed)
Having vaginal itching, started yesterday. When woke up this morning was really bad. ?rash, little bumps down there.

## 2014-11-18 NOTE — Progress Notes (Signed)
Pt rates her itching an 8

## 2014-11-18 NOTE — Discharge Instructions (Signed)

## 2014-11-19 ENCOUNTER — Encounter: Payer: Self-pay | Admitting: Obstetrics

## 2014-11-19 ENCOUNTER — Telehealth: Payer: Self-pay | Admitting: *Deleted

## 2014-11-19 ENCOUNTER — Ambulatory Visit (INDEPENDENT_AMBULATORY_CARE_PROVIDER_SITE_OTHER): Payer: Medicaid Other | Admitting: Obstetrics

## 2014-11-19 VITALS — BP 125/76 | HR 84 | Temp 98.8°F | Ht 61.0 in | Wt 141.0 lb

## 2014-11-19 DIAGNOSIS — A499 Bacterial infection, unspecified: Secondary | ICD-10-CM

## 2014-11-19 DIAGNOSIS — N898 Other specified noninflammatory disorders of vagina: Secondary | ICD-10-CM

## 2014-11-19 DIAGNOSIS — N760 Acute vaginitis: Secondary | ICD-10-CM

## 2014-11-19 DIAGNOSIS — B373 Candidiasis of vulva and vagina: Secondary | ICD-10-CM

## 2014-11-19 DIAGNOSIS — B3731 Acute candidiasis of vulva and vagina: Secondary | ICD-10-CM

## 2014-11-19 DIAGNOSIS — B9689 Other specified bacterial agents as the cause of diseases classified elsewhere: Secondary | ICD-10-CM

## 2014-11-19 LAB — GC/CHLAMYDIA PROBE AMP (~~LOC~~) NOT AT ARMC
Chlamydia: NEGATIVE
NEISSERIA GONORRHEA: NEGATIVE

## 2014-11-19 MED ORDER — TINIDAZOLE 500 MG PO TABS: 1000.0000 mg | ORAL_TABLET | Freq: Every day | ORAL | Status: DC

## 2014-11-19 MED ORDER — FLUCONAZOLE 200 MG PO TABS: ORAL_TABLET | ORAL | Status: DC

## 2014-11-19 MED ORDER — TERCONAZOLE 0.4 % VA CREA: 1.0000 | TOPICAL_CREAM | Freq: Every day | VAGINAL | Status: DC

## 2014-11-19 MED ORDER — CLOTRIMAZOLE 1 % EX CREA: 1.0000 "application " | TOPICAL_CREAM | Freq: Two times a day (BID) | CUTANEOUS | Status: DC

## 2014-11-19 NOTE — Progress Notes (Signed)
Patient ID: Amy Santana, female   DOB: 1971/03/09, 44 y.o.   MRN: [id number]  Chief Complaint  Patient presents with  . Problem    Possible Infection     HPI Amy Santana is a 44 y.o. female.  Vaginal discharge with itching on inside and outside, for the past 3 days.  Seen at [facility] and Rx Fluconazole and Mycolog.  She was not able to fill the Mycolog Rx, but did take the Fluconazole 150mg   Yesterday.  HPI  Past Medical History  Diagnosis Date  . No pertinent past medical history   . Uterine fibroid     Past Surgical History  Procedure Laterality Date  . Nasal polyp excision    . Wisdom tooth extraction      Family History  Problem Relation Age of Onset  . Hypertension Mother     Social History History  Substance Use Topics  . Smoking status: Never Smoker   . Smokeless tobacco: Never Used  . Alcohol Use: No    No Known Allergies  Current Outpatient Prescriptions  Medication Sig Dispense Refill  . benzocaine-resorcinol (VAGISIL) 5-2 % vaginal cream Place 1 application vaginally at bedtime.    . clotrimazole (LOTRIMIN) 1 % cream Apply 1 application topically 2 (two) times daily. External use only. 60 g 2  . ibuprofen (ADVIL,MOTRIN) 600 MG tablet Take 1 tablet (600 mg total) by mouth every 6 (six) hours as needed for pain. (Patient not taking: Reported on 11/19/2014) 30 tablet 5  . Prenatal Vit-Fe Fumarate-FA (PRENATAL MULTIVITAMIN) TABS tablet Take 1 tablet by mouth daily at 12 noon. (Patient not taking: Reported on 11/18/2014) 30 tablet 11  . terconazole (TERAZOL 7) 0.4 % vaginal cream Place 1 applicator vaginally at bedtime. 45 g 0   No current facility-administered medications for this visit.    Review of Systems Review of Systems Constitutional: negative for fatigue and weight loss Respiratory: negative for cough and wheezing Cardiovascular: negative for chest pain, fatigue and palpitations Gastrointestinal: negative for abdominal  pain and change in bowel habits Genitourinary: positive for vaginal discharge with itching Integument/breast: negative for nipple discharge Musculoskeletal:negative for myalgias Neurological: negative for gait problems and tremors Behavioral/Psych: negative for abusive relationship, depression Endocrine: negative for temperature intolerance     Blood pressure 125/76, pulse 84, temperature 98.8 F (37.1 C), height 5\' 1"  (1.549 m), weight 141 lb (63.957 kg), last menstrual period 10/20/2014, currently breastfeeding.  Physical Exam Physical Exam                Abdomen:  normal findings: no organomegaly, soft, non-tender and no hernia  Pelvis:  External genitalia: normal general appearance Urinary system: urethral meatus normal and bladder without fullness, nontender Vaginal: white, cheesy discharge Cervix: normal appearance Adnexa: normal bimanual exam Uterus: anteverted and non-tender, normal size      Data Reviewed Labs Wet Prep positive for few clue cells and few WBC's  Assessment     Candida Vulvovaginitis. BV     Plan    Terazol 7 vaginal cream Rx Tindamax samples dispensed.  Orders Placed This Encounter  Procedures  . SureSwab, Vaginosis/Vaginitis Plus   Meds ordered this encounter  Medications  . DISCONTD: fluconazole (DIFLUCAN) 200 MG tablet    Sig: Take 1 tablet every other day x 3 doses.    Dispense:  3 tablet    Refill:  2  . clotrimazole (LOTRIMIN) 1 % cream    Sig: Apply 1 application topically 2 (two) times daily.  External use only.    Dispense:  60 g    Refill:  2  . terconazole (TERAZOL 7) 0.4 % vaginal cream    Sig: Place 1 applicator vaginally at bedtime.    Dispense:  45 g    Refill:  0

## 2014-11-19 NOTE — Telephone Encounter (Signed)
Patient states she was seen at [facility]U and diagnosed with yeast. Patient states her Diflucal was filled, but her cream was not covered and she is waiting on authorization. Patient state she is so miserable that she cant even sit. Patient wants to be seen . Appointment given.

## 2014-11-23 LAB — SURESWAB, VAGINOSIS/VAGINITIS PLUS
Atopobium vaginae: 6.9 Log (cells/mL)
C. ALBICANS, DNA: DETECTED — AB
C. GLABRATA, DNA: NOT DETECTED
C. PARAPSILOSIS, DNA: NOT DETECTED
C. trachomatis RNA, TMA: NOT DETECTED
C. tropicalis, DNA: NOT DETECTED
Gardnerella vaginalis: >8 Log (cells/mL)
LACTOBACILLUS SPECIES: NOT DETECTED Log (cells/mL)
MEGASPHAERA SPECIES: 6.1 Log (cells/mL)
N. GONORRHOEAE RNA, TMA: NOT DETECTED
T. VAGINALIS RNA, QL TMA: NOT DETECTED

## 2014-11-24 ENCOUNTER — Other Ambulatory Visit: Payer: Self-pay | Admitting: Obstetrics

## 2014-12-30 ENCOUNTER — Ambulatory Visit (INDEPENDENT_AMBULATORY_CARE_PROVIDER_SITE_OTHER): Payer: Medicaid Other | Admitting: Obstetrics

## 2014-12-30 ENCOUNTER — Telehealth: Payer: Self-pay

## 2014-12-30 ENCOUNTER — Encounter: Payer: Self-pay | Admitting: Obstetrics

## 2014-12-30 VITALS — BP 122/84 | HR 79 | Temp 97.8°F | Wt 142.0 lb

## 2014-12-30 DIAGNOSIS — J339 Nasal polyp, unspecified: Secondary | ICD-10-CM | POA: Diagnosis not present

## 2014-12-30 DIAGNOSIS — H1013 Acute atopic conjunctivitis, bilateral: Secondary | ICD-10-CM | POA: Diagnosis not present

## 2014-12-30 DIAGNOSIS — B3731 Acute candidiasis of vulva and vagina: Secondary | ICD-10-CM

## 2014-12-30 DIAGNOSIS — B373 Candidiasis of vulva and vagina: Secondary | ICD-10-CM | POA: Diagnosis not present

## 2014-12-30 DIAGNOSIS — J309 Allergic rhinitis, unspecified: Principal | ICD-10-CM

## 2014-12-30 MED ORDER — TERCONAZOLE 0.4 % VA CREA: 1.0000 | TOPICAL_CREAM | Freq: Every day | VAGINAL | Status: DC

## 2014-12-30 MED ORDER — FLUCONAZOLE 200 MG PO TABS: 200.0000 mg | ORAL_TABLET | Freq: Every day | ORAL | Status: DC

## 2014-12-30 MED ORDER — OLOPATADINE HCL 0.1 % OP SOLN: 1.0000 [drp] | Freq: Two times a day (BID) | OPHTHALMIC | Status: DC

## 2014-12-30 MED ORDER — DESLORATADINE 5 MG PO TABS: 5.0000 mg | ORAL_TABLET | Freq: Every day | ORAL | Status: DC

## 2014-12-30 NOTE — Telephone Encounter (Signed)
Called Amy Santana, Nose and Throat - she has [facility] - patient is getting it changed to [name] and they will refer her - said she would talk to caseworker today - [facility] cannot refer as not on card per [facility] - even though she has been there before

## 2014-12-30 NOTE — Progress Notes (Signed)
Patient ID: Amy Santana, female   DOB: June 14, 1971, 44 y.o.   MRN: [id number]  Chief Complaint  Patient presents with  . Vaginitis    vaginal itching and irritation, increase d/c    HPI Amy Santana is a 44 y.o. female.  Vaginal itching and discharge.  Also c/o seasonal allergies and has a H/O nasal polyps that has been resected  In the past. HPI  Past Medical History  Diagnosis Date  . No pertinent past medical history   . Uterine fibroid     Past Surgical History  Procedure Laterality Date  . Nasal polyp excision    . Wisdom tooth extraction      Family History  Problem Relation Age of Onset  . Hypertension Mother     Social History History  Substance Use Topics  . Smoking status: Never Smoker   . Smokeless tobacco: Never Used  . Alcohol Use: No    No Known Allergies  Current Outpatient Prescriptions  Medication Sig Dispense Refill  . clotrimazole (LOTRIMIN) 1 % cream Apply 1 application topically 2 (two) times daily. External use only. (Patient not taking: Reported on 12/30/2014) 60 g 2  . desloratadine (CLARINEX) 5 MG tablet Take 1 tablet (5 mg total) by mouth daily. 30 tablet 12  . fluconazole (DIFLUCAN) 200 MG tablet Take 1 tablet (200 mg total) by mouth daily. 3 tablet 2  . ibuprofen (ADVIL,MOTRIN) 600 MG tablet Take 1 tablet (600 mg total) by mouth every 6 (six) hours as needed for pain. (Patient not taking: Reported on 11/19/2014) 30 tablet 5  . olopatadine (PATANOL) 0.1 % ophthalmic solution Place 1 drop into both eyes 2 (two) times daily. 5 mL 12  . terconazole (TERAZOL 7) 0.4 % vaginal cream Place 1 applicator vaginally at bedtime. 45 g 2   No current facility-administered medications for this visit.    Review of Systems Review of Systems Constitutional: negative for fatigue and weight loss Respiratory: positive for nasal congestion, and burning of eyes. Cardiovascular: negative for chest pain, fatigue and palpitations Gastrointestinal:  negative for abdominal pain and change in bowel habits Genitourinary: vaginal discharge with itching Integument/breast: negative for nipple discharge Musculoskeletal:negative for myalgias Neurological: negative for gait problems and tremors Behavioral/Psych: negative for abusive relationship, depression Endocrine: negative for temperature intolerance     Blood pressure 122/84, pulse 79, temperature 97.8 F (36.6 C), weight 142 lb (64.411 kg), last menstrual period 12/20/2014, currently breastfeeding.  Physical Exam Physical Exam General:   alert                 Pelvis:  External genitalia: normal general appearance Urinary system: urethral meatus normal and bladder without fullness, nontender Vaginal: normal without tenderness, induration or masses.  Thick, white curdy discharge Cervix: normal appearance Adnexa: normal bimanual exam Uterus: anteverted and non-tender, normal size      Data Reviewed Previous wet prep and cultures  Assessment     Candida vulvovaginitis Seasonal Allergies Nasal polyps    Plan    Diflucan / Terazol 7 Rx Ophthalmic drops Rx Clarinex Rx Referred to ENT for F/U of nasal polyps  Orders Placed This Encounter  Procedures  . SureSwab, Vaginosis/Vaginitis Plus  . Ambulatory referral to ENT    Referral Priority:  Routine    Referral Type:  Consultation    Referral Reason:  Specialty Services Required    Requested Specialty:  Otolaryngology    Number of Visits Requested:  1   Meds ordered this encounter  Medications  .  olopatadine (PATANOL) 0.1 % ophthalmic solution    Sig: Place 1 drop into both eyes 2 (two) times daily.    Dispense:  5 mL    Refill:  12  . desloratadine (CLARINEX) 5 MG tablet    Sig: Take 1 tablet (5 mg total) by mouth daily.    Dispense:  30 tablet    Refill:  12  . terconazole (TERAZOL 7) 0.4 % vaginal cream    Sig: Place 1 applicator vaginally at bedtime.    Dispense:  45 g    Refill:  2  . fluconazole  (DIFLUCAN) 200 MG tablet    Sig: Take 1 tablet (200 mg total) by mouth daily.    Dispense:  3 tablet    Refill:  2

## 2014-12-31 ENCOUNTER — Other Ambulatory Visit: Payer: Self-pay | Admitting: *Deleted

## 2014-12-31 DIAGNOSIS — J302 Other seasonal allergic rhinitis: Secondary | ICD-10-CM

## 2014-12-31 MED ORDER — LORATADINE 10 MG PO TABS: 10.0000 mg | ORAL_TABLET | Freq: Every day | ORAL | Status: DC

## 2014-12-31 MED ORDER — PATADAY 0.2 % OP SOLN: 1.0000 [drp] | Freq: Two times a day (BID) | OPHTHALMIC | Status: DC

## 2014-12-31 NOTE — Progress Notes (Signed)
Rx change due to insurance coverage.

## 2015-01-06 LAB — SURESWAB, VAGINOSIS/VAGINITIS PLUS
Atopobium vaginae: DETECTED Log (cells/mL)
C. ALBICANS, DNA: DETECTED — AB
C. GLABRATA, DNA: NOT DETECTED
C. parapsilosis, DNA: DETECTED — AB
C. trachomatis RNA, TMA: NOT DETECTED
C. tropicalis, DNA: NOT DETECTED
Gardnerella vaginalis: 7.6 Log (cells/mL)
LACTOBACILLUS SPECIES: NOT DETECTED Log (cells/mL)
MEGASPHAERA SPECIES: NOT DETECTED Log (cells/mL)
N. GONORRHOEAE RNA, TMA: NOT DETECTED
T. vaginalis RNA, QL TMA: NOT DETECTED

## 2015-01-07 ENCOUNTER — Other Ambulatory Visit: Payer: Self-pay | Admitting: Obstetrics

## 2015-01-07 DIAGNOSIS — N760 Acute vaginitis: Principal | ICD-10-CM

## 2015-01-07 DIAGNOSIS — B9689 Other specified bacterial agents as the cause of diseases classified elsewhere: Secondary | ICD-10-CM

## 2015-01-07 MED ORDER — TINIDAZOLE 500 MG PO TABS: 1000.0000 mg | ORAL_TABLET | Freq: Every day | ORAL | Status: DC

## 2015-01-09 ENCOUNTER — Other Ambulatory Visit: Payer: Self-pay | Admitting: *Deleted

## 2015-01-09 DIAGNOSIS — B9689 Other specified bacterial agents as the cause of diseases classified elsewhere: Secondary | ICD-10-CM

## 2015-01-09 DIAGNOSIS — N760 Acute vaginitis: Principal | ICD-10-CM

## 2015-01-09 MED ORDER — METRONIDAZOLE 500 MG PO TABS: 500.0000 mg | ORAL_TABLET | Freq: Two times a day (BID) | ORAL | Status: DC

## 2015-02-10 ENCOUNTER — Other Ambulatory Visit: Payer: Self-pay | Admitting: *Deleted

## 2015-02-15 IMAGING — US US OB FOLLOW-UP
1 series · 12 of 28 positions shown · non-contrast
Comparison: none

[Series 1: us ob follow-up · 0.23mm/px · 12 of 51 slices shown]
[im 2/51]
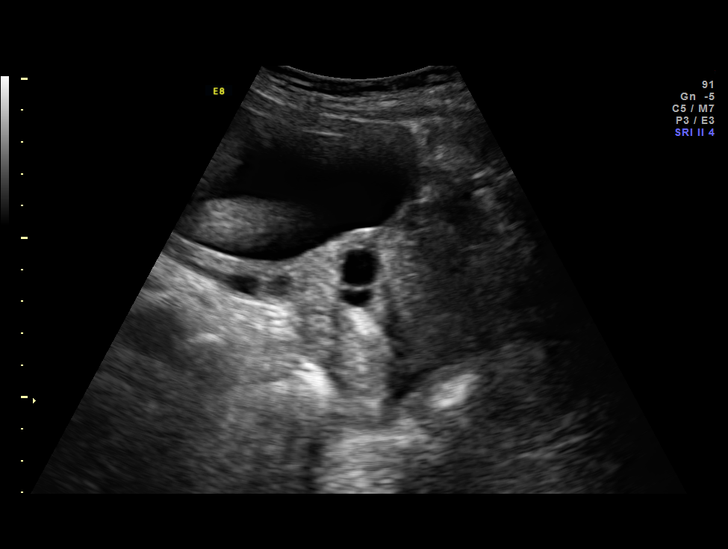
[im 6/51]
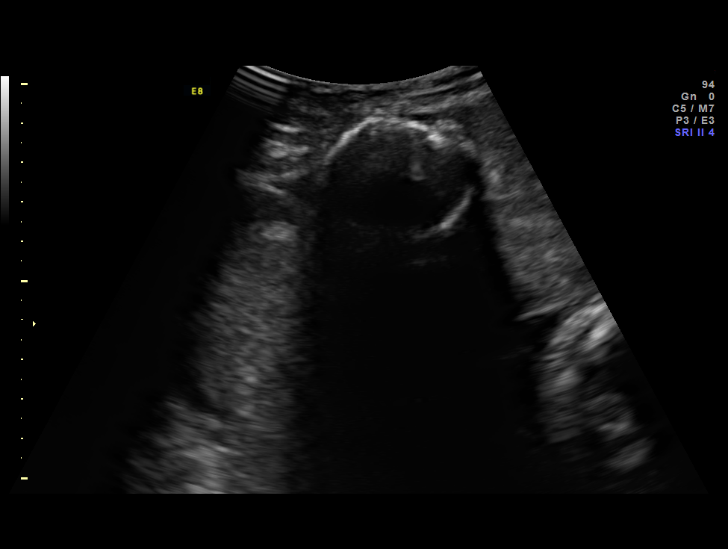
[im 10/51]
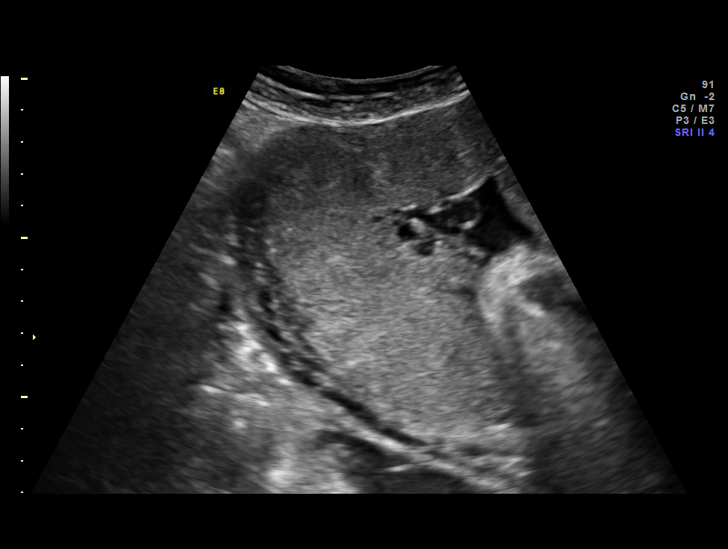
[im 15/51]
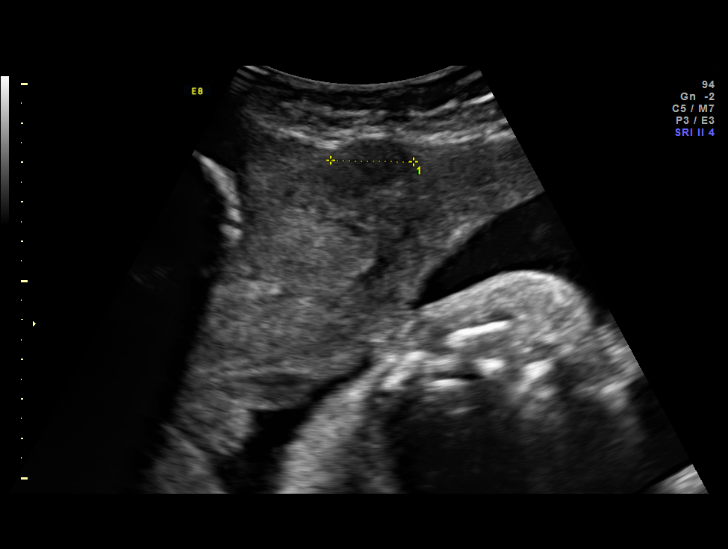
[im 19/51]
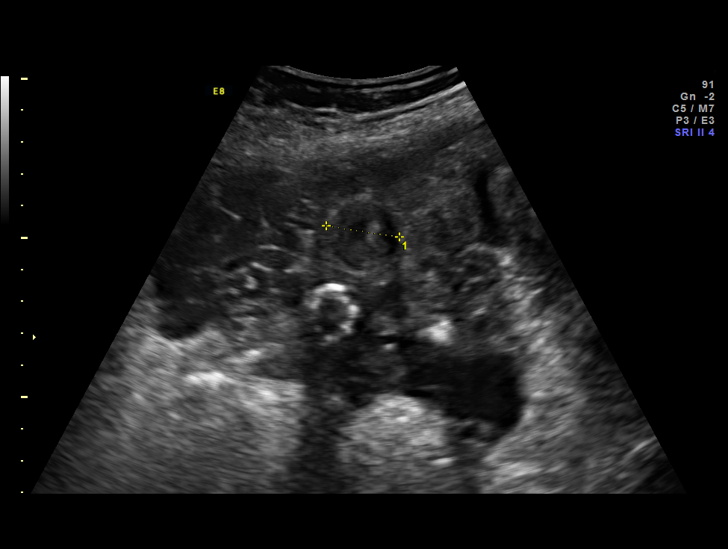
[im 23/51]
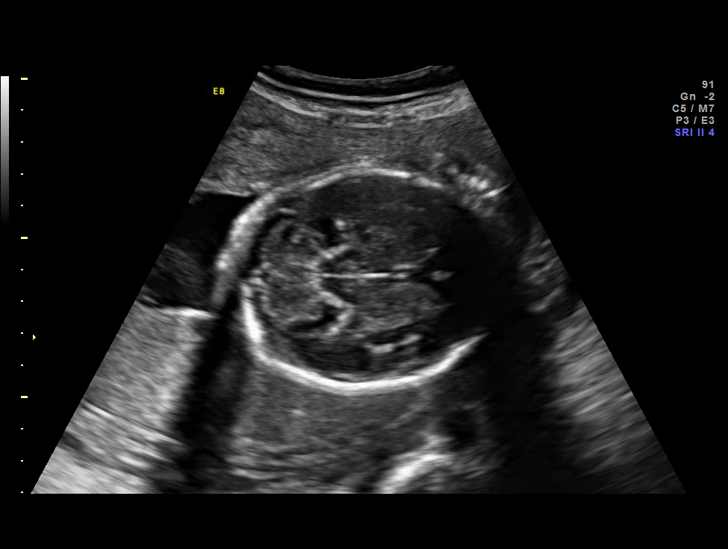
[im 28/51]
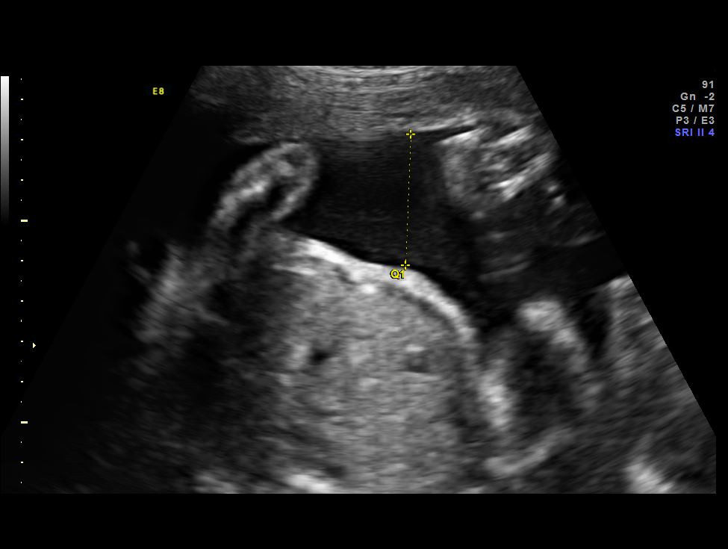
[im 32/51]
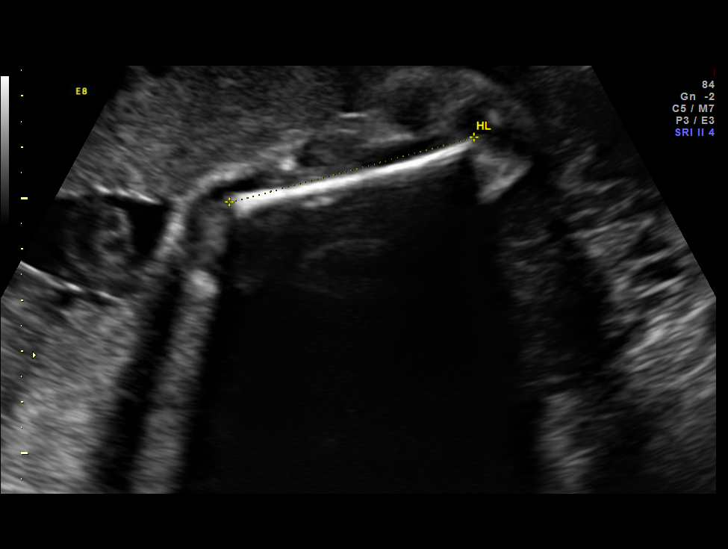
[im 36/51]
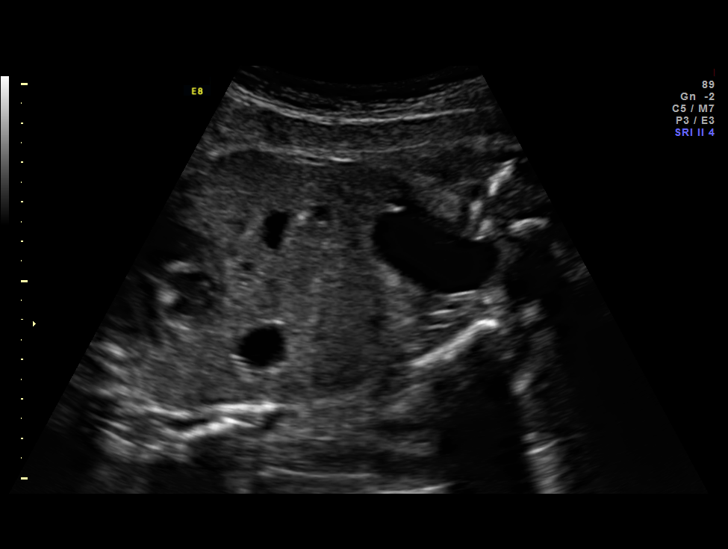
[im 41/51]
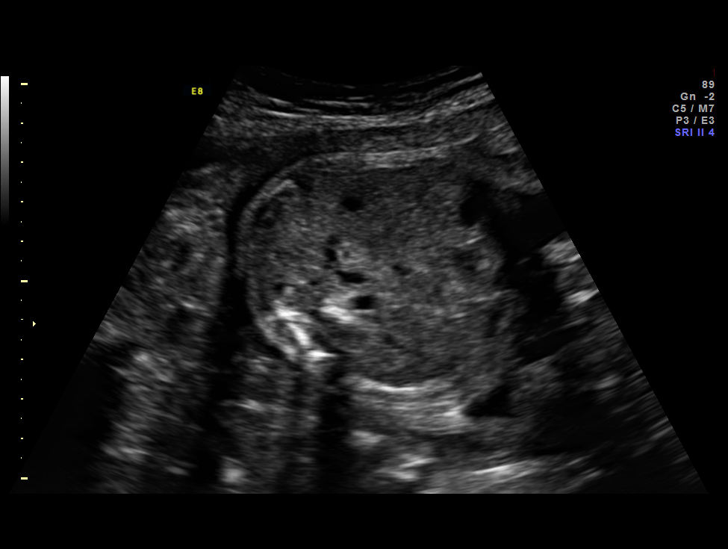
[im 45/51]
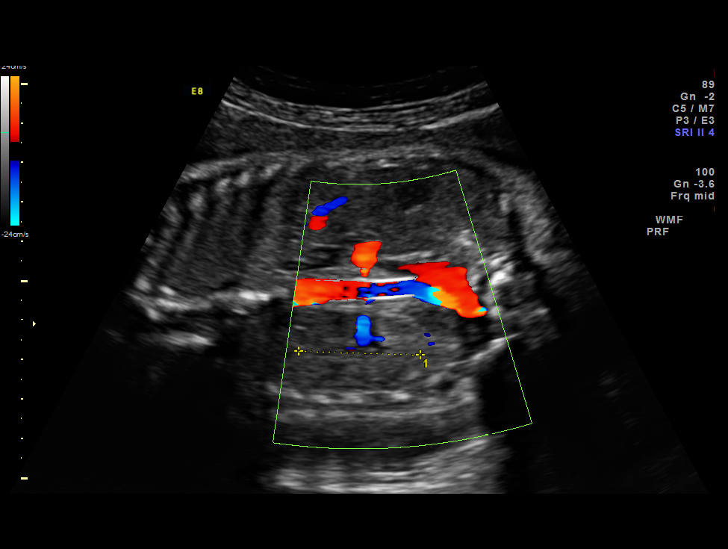
[im 49/51]
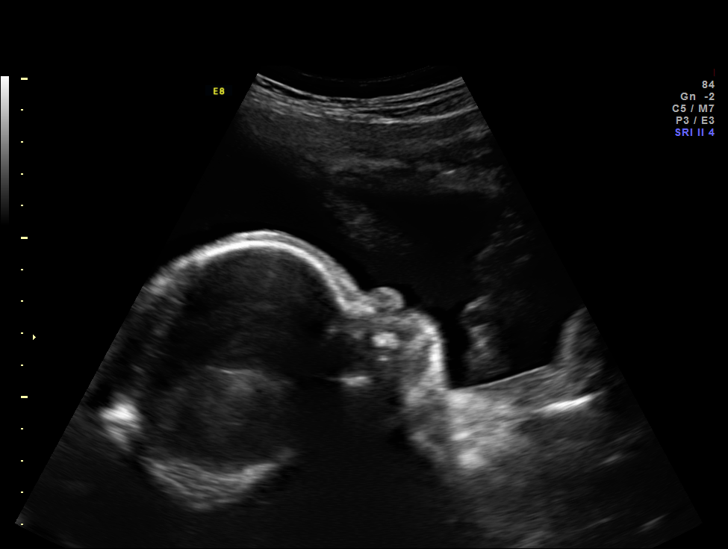

[12 of 28 positions shown; findings below may reference images not displayed]

OBSTETRICS REPORT
                      (Signed Final 04/19/2013 [DATE])

             AGYAPOMAH

Service(s) Provided

 US OB FOLLOW UP                                       76816.1
Indications

 Advanced maternal age (AMA), Multigravida
 Uterine fibroids
Fetal Evaluation

 Num Of Fetuses:    1
 Fetal Heart Rate:  144                          bpm
 Cardiac Activity:  Observed
 Presentation:      Breech
 Placenta:          Right lateral, above
                    cervical os
 P. Cord            Previously Visualized
 Insertion:

 Amniotic Fluid
 AFI FV:      Subjectively within normal limits
 AFI Sum:     12.65   cm       33  %Tile     Larg Pckt:    4.02  cm
 RUQ:   3.25    cm   RLQ:    2.11   cm    LUQ:   4.02    cm   LLQ:    3.27   cm
Biometry

 BPD:     68.8  mm     G. Age:  27w 4d                CI:         72.7   70 - 86
 OFD:     94.6  mm                                    FL/HC:      20.1   18.8 -

 HC:     264.9  mm     G. Age:  28w 6d       51  %    HC/AC:      1.07   1.05 -

 AC:     247.4  mm     G. Age:  29w 0d       76  %    FL/BPD:     77.5   71 - 87
 FL:      53.3  mm     G. Age:  28w 2d       48  %    FL/AC:      21.5   20 - 24
 HUM:       50  mm     G. Age:  29w 2d       76  %

 Est. FW:    8273  gm    2 lb 12 oz      67  %
Gestational Age

 LMP:           25w 4d        Date:  10/22/12                 EDD:   07/29/13
 U/S Today:     28w 3d                                        EDD:   07/09/13
 Best:          27w 6d     Det. By:  Early Ultrasound         EDD:   07/13/13
                                     (11/18/12)
Anatomy

 Cranium:          Appears normal         Aortic Arch:      Previously seen
 Fetal Cavum:      Appears normal         Ductal Arch:      Previously seen
 Ventricles:       Appears normal         Diaphragm:        Appears normal
 Choroid Plexus:   Previously seen        Stomach:          Appears normal
 Cerebellum:       Appears normal         Abdomen:          Previously seen
 Posterior Fossa:  Previously seen        Abdominal Wall:   Previously seen
 Nuchal Fold:      Previously seen        Cord Vessels:     Previously seen
 Face:             Orbits and profile     Kidneys:          Appear normal
                   previously seen
 Lips:             Previously seen        Bladder:          Appears normal
 Heart:            Appears normal         Spine:            Previously seen
                   (4CH, axis, and
                   situs)
 RVOT:             Previously seen        Lower             Previously seen
                                          Extremities:
 LVOT:             Previously seen        Upper             Previously seen
                                          Extremities:

 Other:  Male gender. Heels and 5th digit previously seen.
Cervix Uterus Adnexa

 Cervical Length:    4.7      cm

 Cervix:       Normal appearance by transabdominal scan. Appears
               closed, without funnelling.
Myomas

 Site                     L(cm)      W(cm)       D(cm)      Location
 LUS Right
 LUS/Mid Right            2
 Fundus Right
 Anterior Mid

 Blood Flow                  RI       PI       Comments

Impression

 Single IUP at 27 [DATE] weeks
 Normal interval anatomy
 Fetal growth is appropriate (67th %tile)
 Multiple calcified uterine myomas noted (see above)-
 unchanged in size since previous evaluation
 Normal amniotic fluid volume
Recommendations

 Recommend follow up in 4 weeks for interval growth.
 Recommend antepartum fetal testing beginning at 36 weeks
 due to advanced maternal age > 40

## 2015-03-14 IMAGING — US US OB FOLLOW-UP
1 series · 12 of 28 positions shown · non-contrast
Comparison: none

[Series 1: us ob follow-up · 0.21mm/px · 34 acquisitions, 12 frames shown]
[im 2/34]
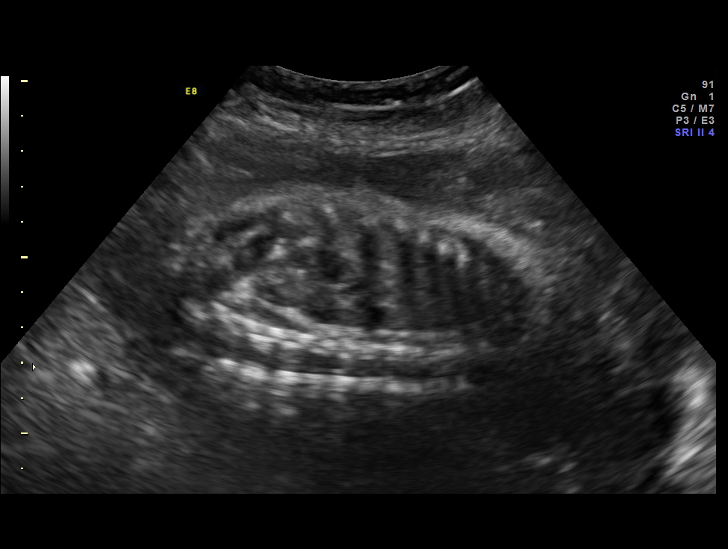
[im 4/34]
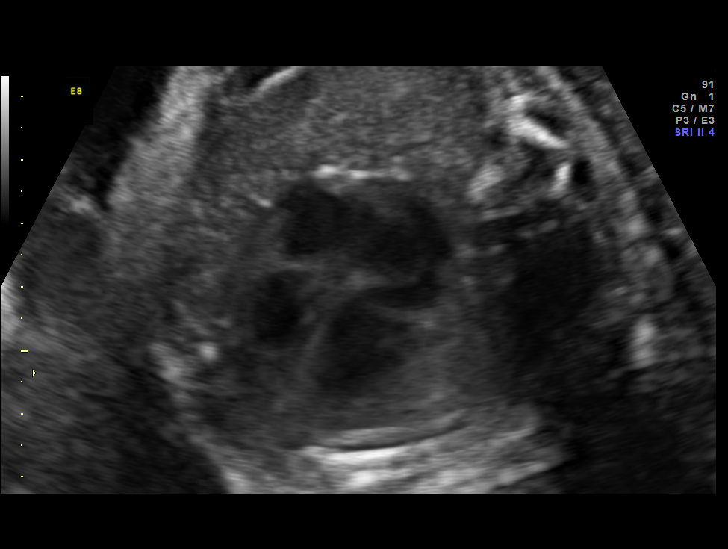
[im 7/34]
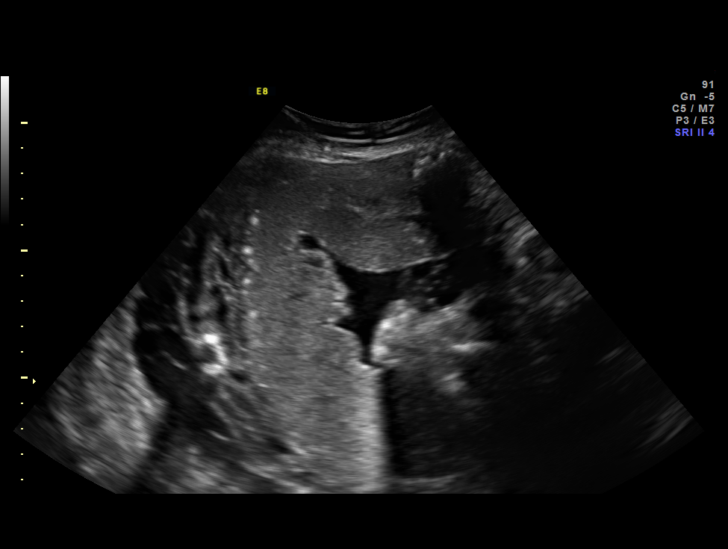
[im 10/34]
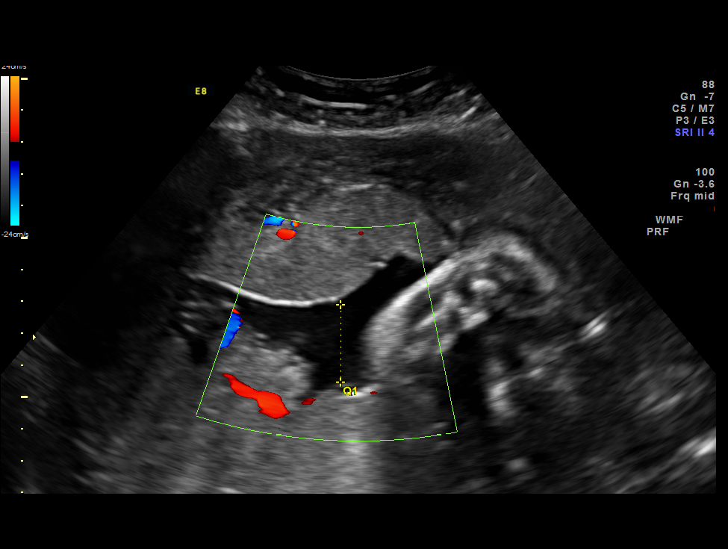
[im 13/34]
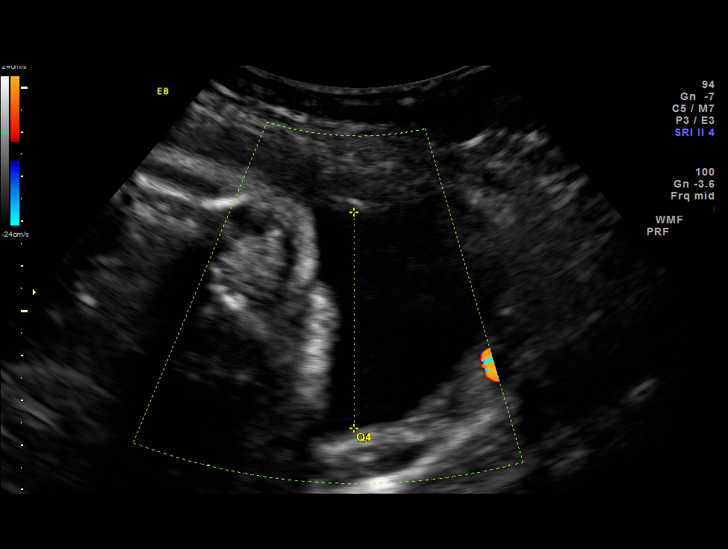
[im 15/34]
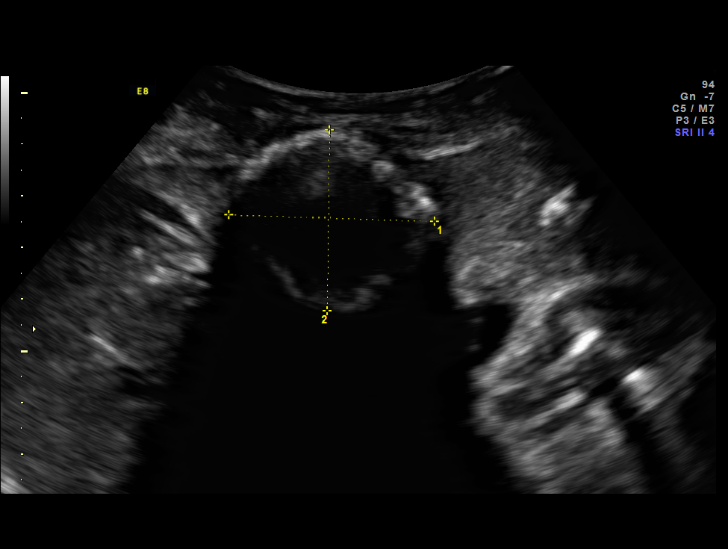
[im 19/34]
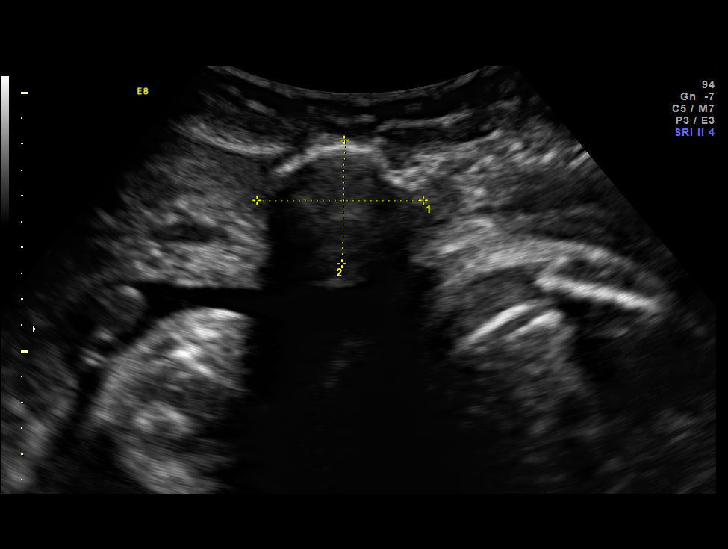
[im 21/34]
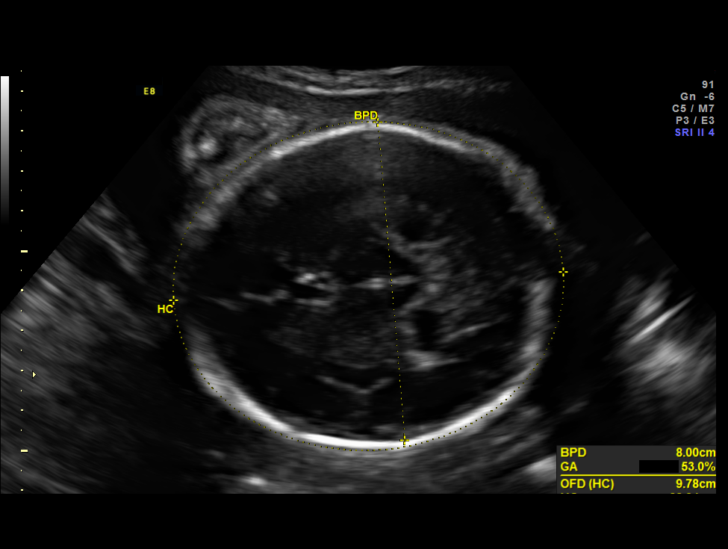
[im 24/34]
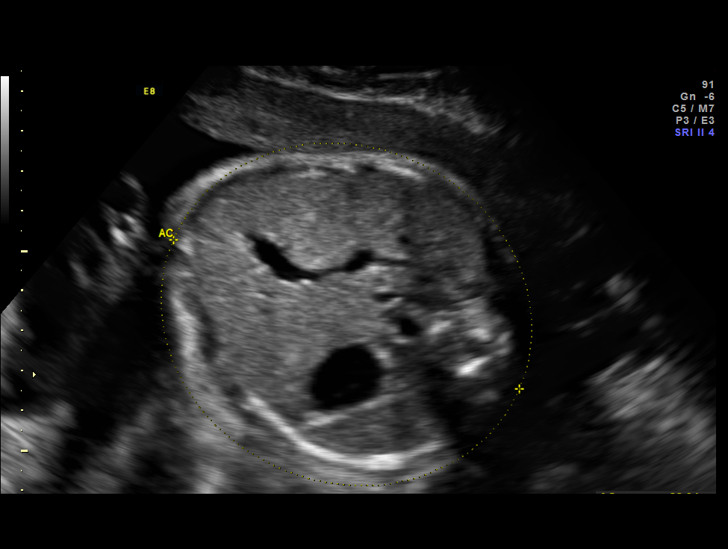
[im 27/34]
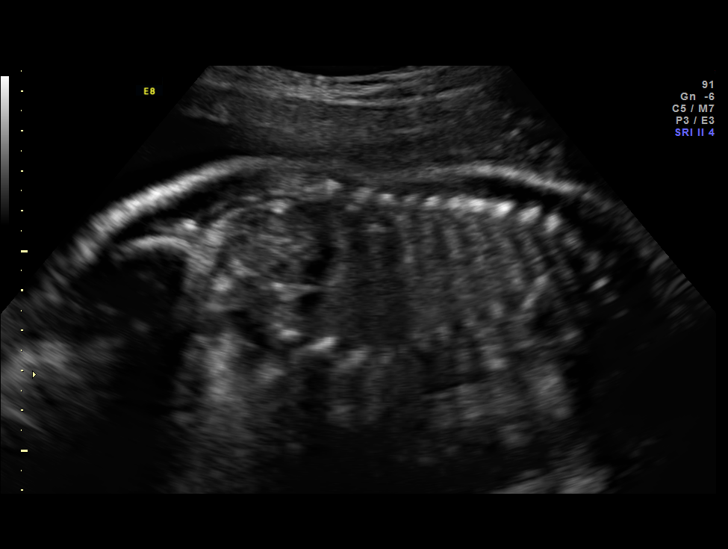
[im 30/34]
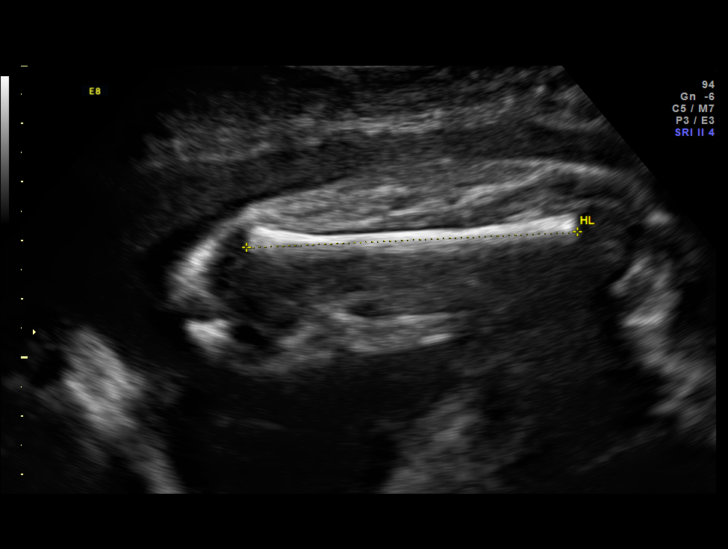
[im 32/34]
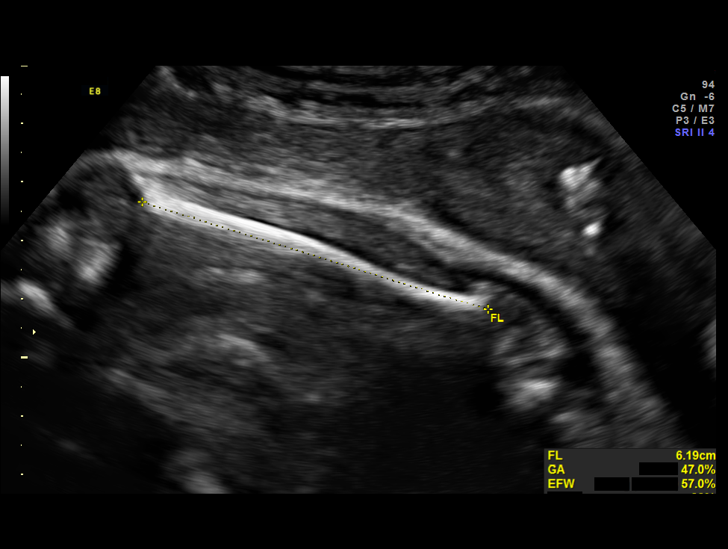

[12 of 28 positions shown; findings below may reference images not displayed]

OBSTETRICS REPORT
                      (Signed Final 05/16/2013 [DATE])

             AGYAPOMAH

Service(s) Provided

 US OB FOLLOW UP                                       76816.1
Indications

 Advanced maternal age (42), Multigravida
 Uterine fibroids
Fetal Evaluation

 Num Of Fetuses:    1
 Fetal Heart Rate:  135                          bpm
 Cardiac Activity:  Observed
 Presentation:      Cephalic
 Placenta:          Right lateral, above
                    cervical os
 P. Cord            Previously Visualized
 Insertion:

 Amniotic Fluid
 AFI FV:      Subjectively within normal limits
 AFI Sum:     9.73    cm       13  %Tile     Larg Pckt:    4.83  cm
 RUQ:   2.43    cm   RLQ:    4.83   cm    LUQ:   1.42    cm   LLQ:    1.05   cm
Biometry

 BPD:     79.8  mm     G. Age:  32w 0d                CI:         80.9   70 - 86
 OFD:     98.6  mm                                    FL/HC:      21.8   19.1 -

 HC:     284.6  mm     G. Age:  31w 2d        9  %    HC/AC:      1.02   0.96 -

 AC:     277.7  mm     G. Age:  31w 6d       52  %    FL/BPD:     77.8   71 - 87
 FL:      62.1  mm     G. Age:  32w 1d       50  %    FL/AC:      22.4   20 - 24
 HUM:     56.5  mm     G. Age:  32w 6d       73  %

 Est. FW:    3330  gm      4 lb 2 oz     59  %
Gestational Age

 LMP:           29w 3d        Date:  10/22/12                 EDD:   07/29/13
 U/S Today:     31w 6d                                        EDD:   07/12/13
 Best:          31w 5d     Det. By:  Early Ultrasound         EDD:   07/13/13
                                     (11/18/12)
Anatomy

 Cranium:          Appears normal         Aortic Arch:      Previously seen
 Fetal Cavum:      Appears normal         Ductal Arch:      Previously seen
 Ventricles:       Appears normal         Diaphragm:        Appears normal
 Choroid Plexus:   Previously seen        Stomach:          Appears normal
 Cerebellum:       Previously seen        Abdomen:          Appears normal
 Posterior Fossa:  Previously seen        Abdominal Wall:   Previously seen
 Nuchal Fold:      Previously seen        Cord Vessels:     Previously seen
 Face:             Orbits and profile     Kidneys:          Appear normal
                   previously seen
 Lips:             Previously seen        Bladder:          Appears normal
 Heart:            Appears normal         Spine:            Previously seen
                   (4CH, axis, and
                   situs)
 RVOT:             Previously seen        Lower             Previously seen
                                          Extremities:
 LVOT:             Previously seen        Upper             Previously seen
                                          Extremities:

 Other:  Male gender. Heels and 5th digit previously seen.
Cervix Uterus Adnexa

 Cervix:       Not visualized (advanced GA >81wks)
Myomas

 Site                     L(cm)      W(cm)       D(cm)      Location
 LUS Right
 LUS/Mid Right            2
 Fundus Right             3.7        4
 Anterior left
 Fundus                   1.6        1.6         1

 Blood Flow                  RI       PI       Comments
                                               Previously seen
                                               Previously seen

Impression

 IUP at 31+5 weeks
 Normal interval anatomy; anatomic survey complete
 Normal amniotic fluid volume
 Appropriate interval growth with EFW at the 59th %tile
 Fibroid uterus: see above for size and location
Recommendations

 Follow-up ultrasound for growth in 4 weeks plus NST (40 y/o
 or >)

## 2015-04-16 IMAGING — US US OB LIMITED
1 series · 13 of 16 positions shown · non-contrast
Comparison: none

[Series 1: us ob limited · 0.32mm/px · 13 of 16 slices shown]
[im 1/16]
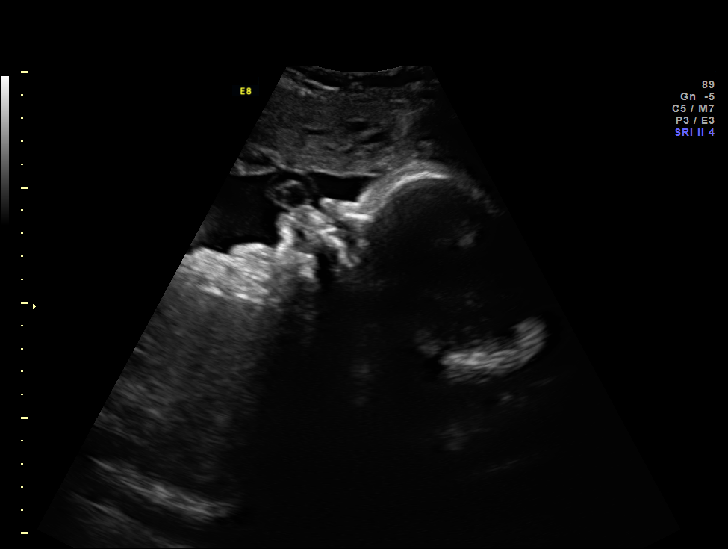
[im 2/16]
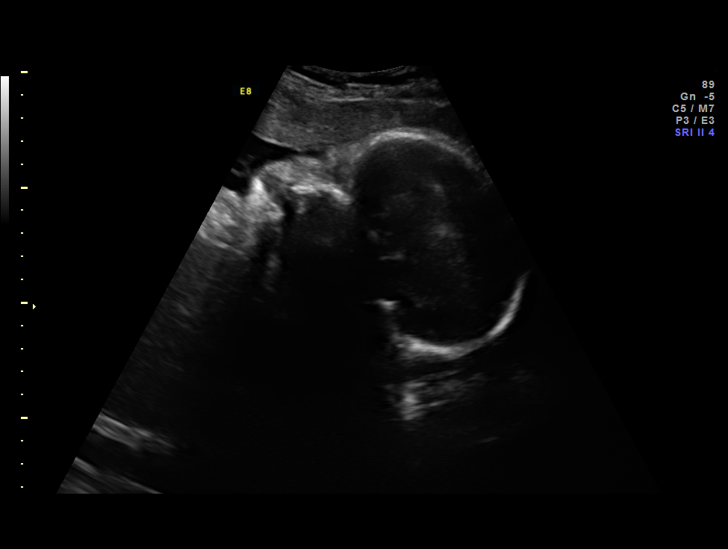
[im 4/16]
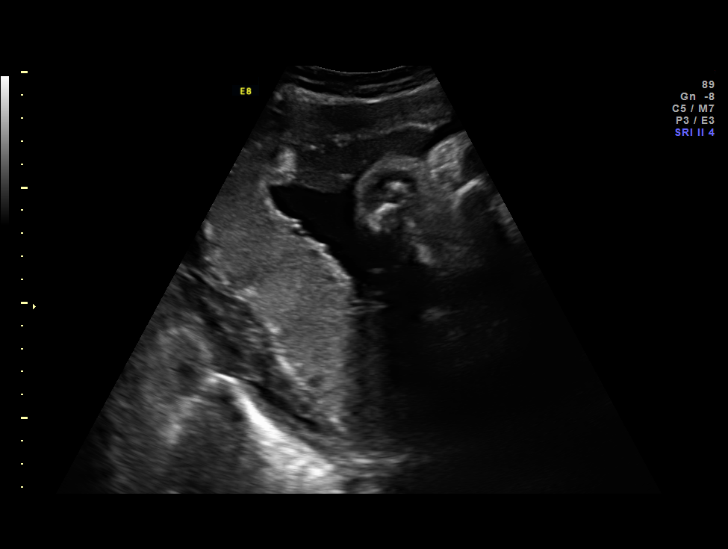
[im 5/16]
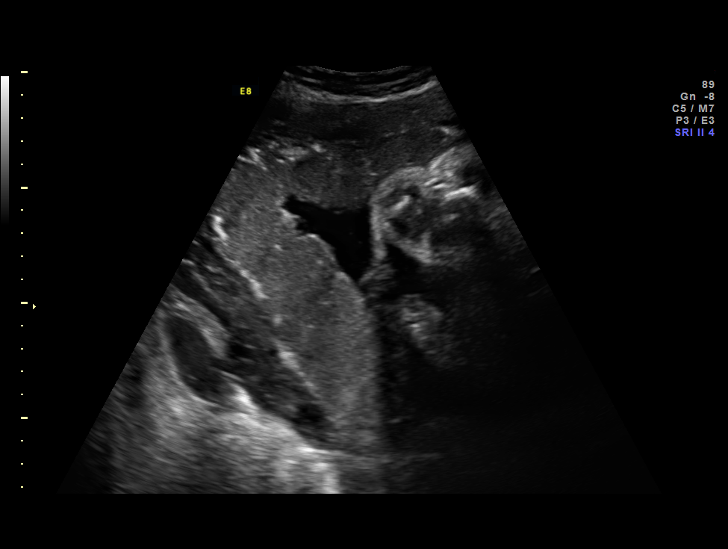
[im 6/16]
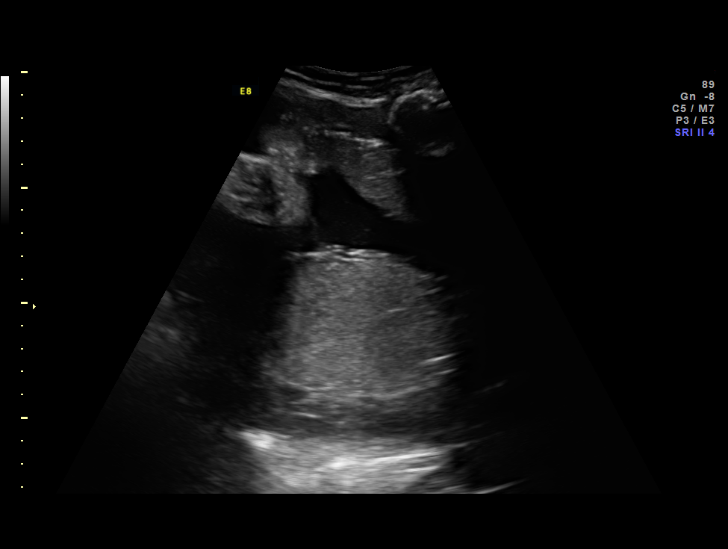
[im 7/16]
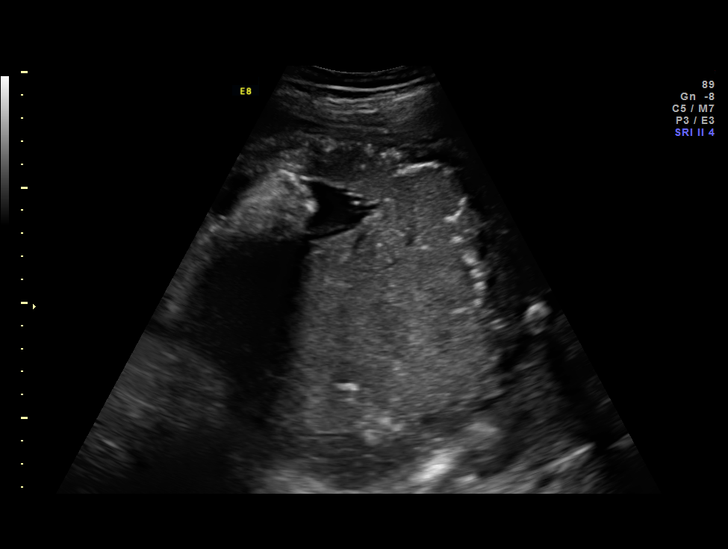
[im 9/16]
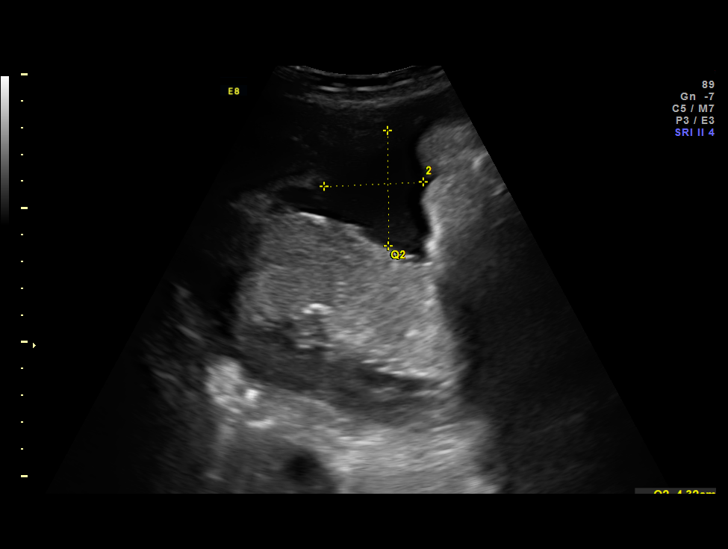
[im 10/16]
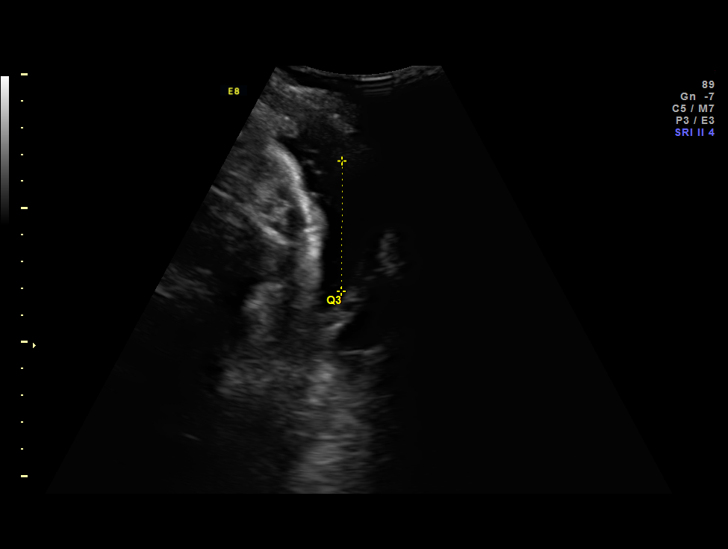
[im 11/16]
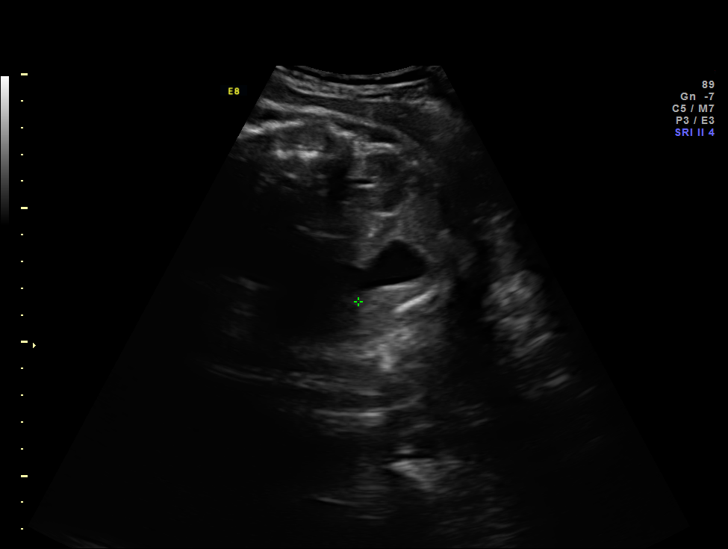
[im 12/16]
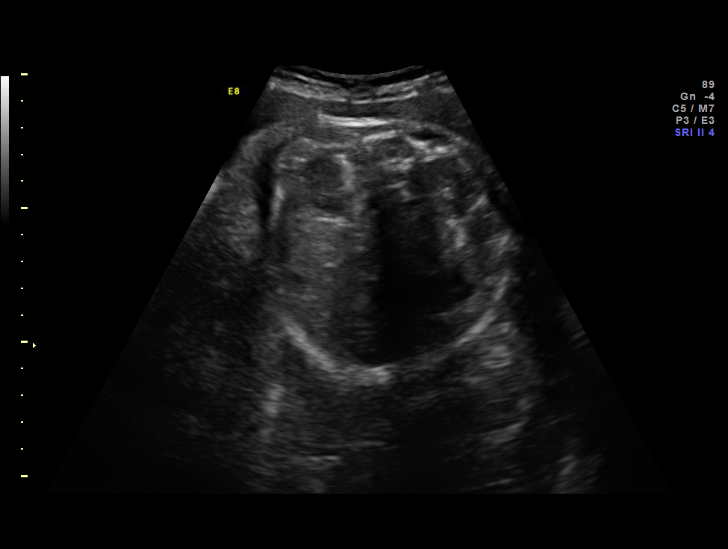
[im 13/16]
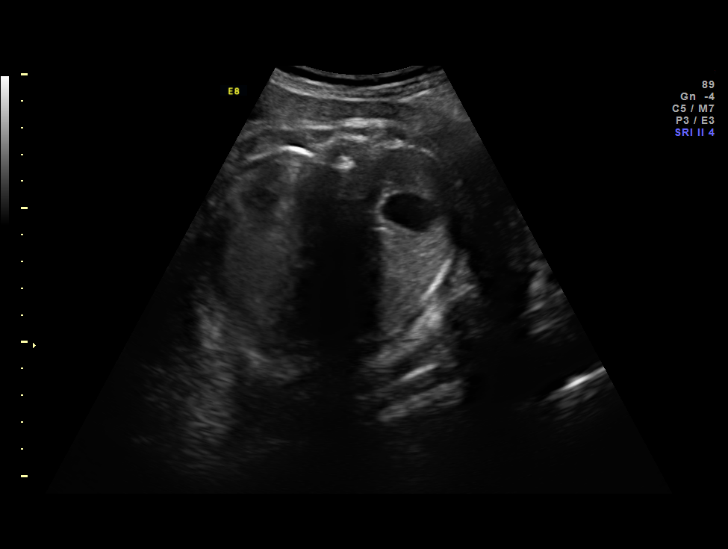
[im 15/16]
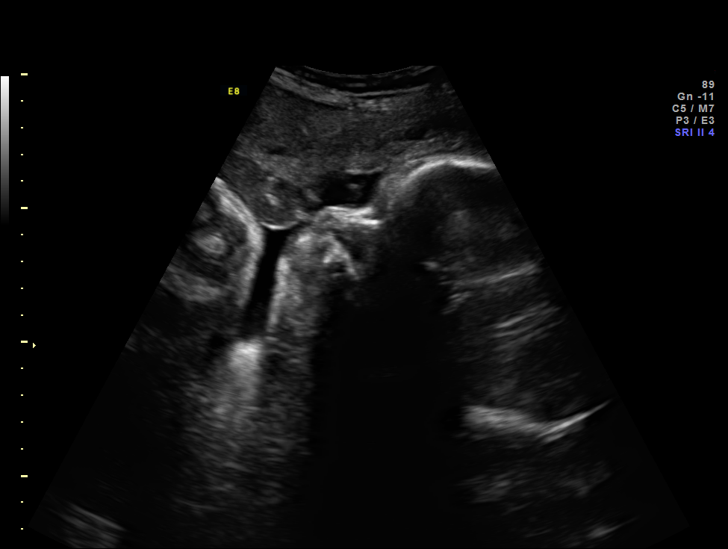
[im 16/16]
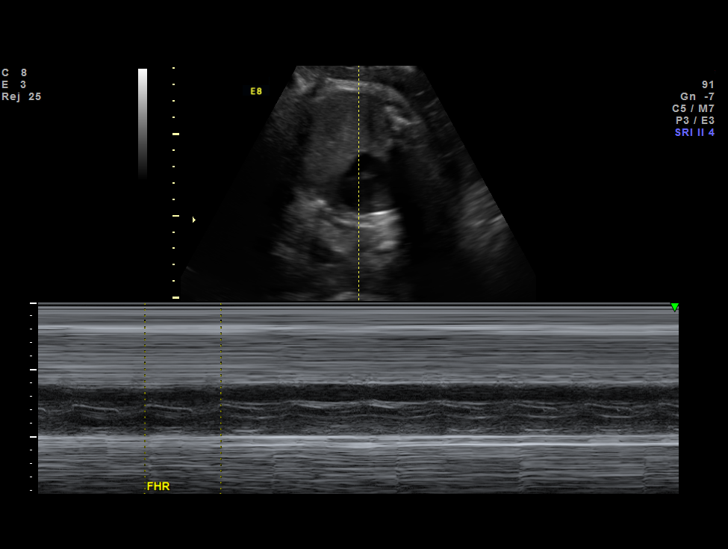

[13 of 16 positions shown; findings below may reference images not displayed]

OBSTETRICS REPORT
                      (Signed Final 06/18/2013 [DATE])

             AGYAPOMAH

Service(s) Provided

 [HOSPITAL]                                         76815.0
Indications

 Advanced maternal age (42), Multigravida
 Uterine fibroids
Fetal Evaluation

 Num Of Fetuses:    1
 Fetal Heart Rate:  132                          bpm
 Cardiac Activity:  Observed
 Presentation:      Cephalic
 Placenta:          Right lateral, above
                    cervical os
 P. Cord            Previously Visualized
 Insertion:

 Amniotic Fluid
 AFI FV:      Subjectively within normal limits
 AFI Sum:     12.89   cm       44  %Tile     Larg Pckt:    4.88  cm
 RUQ:   3.69    cm   LUQ:    4.32   cm    LLQ:   4.88    cm
Biophysical Evaluation

 Amniotic F.V:   Within normal limits       F. Tone:        Observed
 F. Movement:    Observed                   Score:          [DATE]
 F. Breathing:   Observed
Gestational Age

 LMP:           34w 1d        Date:  10/22/12                 EDD:   07/29/13
 Best:          36w 3d     Det. By:  Early Ultrasound         EDD:   07/13/13
                                     (11/18/12)
Cervix Uterus Adnexa

 Cervix:       Not visualized (advanced GA >34 wks)
Impression

 Single IUP at 36 [DATE] weeks
 Active fetus with BPP of [DATE]
 Normal amniotic fluid volume
Recommendations

 Recommend 2x weekly NSTs with weekly AFIs.
 Recommend delivery by EDD but not prior to 39 weeks
 gestation if testing is otherwise reassuring.

## 2015-05-29 ENCOUNTER — Ambulatory Visit (INDEPENDENT_AMBULATORY_CARE_PROVIDER_SITE_OTHER): Payer: BLUE CROSS/BLUE SHIELD | Admitting: Physician Assistant

## 2015-05-29 VITALS — BP 122/74 | HR 95 | Temp 98.5°F | Resp 17 | Ht 60.0 in | Wt 149.0 lb

## 2015-05-29 DIAGNOSIS — J020 Streptococcal pharyngitis: Secondary | ICD-10-CM | POA: Diagnosis not present

## 2015-05-29 LAB — POCT RAPID STREP A (OFFICE): RAPID STREP A SCREEN: POSITIVE — AB

## 2015-05-29 MED ORDER — AMOXICILLIN 875 MG PO TABS: 875.0000 mg | ORAL_TABLET | Freq: Two times a day (BID) | ORAL | Status: AC

## 2015-05-29 NOTE — Progress Notes (Signed)
[facility] [address] [place] [address] [phone]  Date:  05/29/2015   Name:  Amy Santana   DOB:  [birth year]   MRN:  [id number]  PCP:  No primary care provider on file.    History of Present Illness:  Amy Santana is a 44 y.o. female patient who presents to [facility] for sorethroat and body aches over the last 2 days.  She has had no fever, but has generalized body aches and fatigue.  No nasal congestion, ear discomfort, cough, or sob. Mild abdominal pain without nausea or vomiting.    Patient Active Problem List   Diagnosis Date Noted  . Seasonal allergic rhinitis 01/17/2013  . GERD without esophagitis 01/17/2013  . Allergic rhinitis, seasonal 01/17/2013  . Nasal polyp 01/04/2013    Past Medical History  Diagnosis Date  . No pertinent past medical history   . Uterine fibroid     Past Surgical History  Procedure Laterality Date  . Nasal polyp excision    . Wisdom tooth extraction      Social History  Substance Use Topics  . Smoking status: Never Smoker   . Smokeless tobacco: Never Used  . Alcohol Use: No    Family History  Problem Relation Age of Onset  . Hypertension Mother     No Known Allergies  Medication list has been reviewed and updated.  No current outpatient prescriptions on file prior to visit.   No current facility-administered medications on file prior to visit.    ROS ROS otherwise unremarkable unless listed above.   Physical Examination: BP 122/74 mmHg  Pulse 95  Temp(Src) 98.5 F (36.9 C) (Oral)  Resp 17  Ht 5' (1.524 m)  Wt 149 lb (67.586 kg)  BMI 29.10 kg/m2  SpO2 98%  LMP 05/18/2015  Breastfeeding? Yes Ideal Body Weight: Weight in (lb) to have BMI = 25: 127.7  Physical Exam  Constitutional: She appears well-developed and well-nourished. No distress.  HENT:  Right Ear: Tympanic membrane, external ear and ear canal normal.  Left Ear: Tympanic membrane, external ear and ear canal  normal.  Nose: No mucosal edema or rhinorrhea.  Mouth/Throat: Normal dentition. No uvula swelling. Oropharyngeal exudate (Mild exudate at left tonsil) and posterior oropharyngeal erythema present. Posterior oropharyngeal edema: 1+  Cardiovascular: Normal rate, regular rhythm and normal heart sounds.  Exam reveals no gallop and no friction rub.   No murmur heard. Pulmonary/Chest: No apnea. No respiratory distress. She has no decreased breath sounds. She has no wheezes. She has no rhonchi.  Abdominal: Soft. Normal appearance and bowel sounds are normal. There is no tenderness.  Skin: She is not diaphoretic.    Results for orders placed or performed in visit on 05/29/15  POCT rapid strep A  Result Value Ref Range   Rapid Strep A Screen Positive (A) Negative    Assessment and Plan: 44 year old female with PMH listed above is here today for chief complaint of throat pain.   -Treating amoxicillin bid 10 days -Declines magic mouth wash -Advised tylenol/ibuprofen, and cepacol lozenges for pain.  Streptococcal sore throat - Plan: POCT rapid strep A, amoxicillin (AMOXIL) 875 MG tablet     Ivar Drape, PA-C [facility] 05/29/2015 1:30 PM

## 2015-05-29 NOTE — Patient Instructions (Addendum)
You can purchase over the counter cepacol lozenges, and/or use tylenol or ibuprofen for throat pain.  Strep Throat Strep throat is an infection of the throat caused by a bacteria named Streptococcus pyogenes. Your health care provider may call the infection streptococcal "tonsillitis" or "pharyngitis" depending on whether there are signs of inflammation in the tonsils or back of the throat. Strep throat is most common in children aged 43-15 years during the cold months of the year, but it can occur in people of any age during any season. This infection is spread from person to person (contagious) through coughing, sneezing, or other close contact. SIGNS AND SYMPTOMS   Fever or chills.  Painful, swollen, red tonsils or throat.  Pain or difficulty when swallowing.  White or yellow spots on the tonsils or throat.  Swollen, tender lymph nodes or "glands" of the neck or under the jaw.  Red rash all over the body (rare). DIAGNOSIS  Many different infections can cause the same symptoms. A test must be done to confirm the diagnosis so the right treatment can be given. A "rapid strep test" can help your health care provider make the diagnosis in a few minutes. If this test is not available, a light swab of the infected area can be used for a throat culture test. If a throat culture test is done, results are usually available in a day or two. TREATMENT  Strep throat is treated with antibiotic medicine. HOME CARE INSTRUCTIONS   Gargle with 1 tsp of salt in 1 cup of warm water, 3-4 times per day or as needed for comfort.  Family members who also have a sore throat or fever should be tested for strep throat and treated with antibiotics if they have the strep infection.  Make sure everyone in your household washes their hands well.  Do not share food, drinking cups, or personal items that could cause the infection to spread to others.  You may need to eat a soft food diet until your sore throat gets  better.  Drink enough water and fluids to keep your urine clear or pale yellow. This will help prevent dehydration.  Get plenty of rest.  Stay home from school, day care, or work until you have been on antibiotics for 24 hours.  Take medicines only as directed by your health care provider.  Take your antibiotic medicine as directed by your health care provider. Finish it even if you start to feel better. SEEK MEDICAL CARE IF:   The glands in your neck continue to enlarge.  You develop a rash, cough, or earache.  You cough up green, yellow-brown, or bloody sputum.  You have pain or discomfort not controlled by medicines.  Your problems seem to be getting worse rather than better.  You have a fever. SEEK IMMEDIATE MEDICAL CARE IF:   You develop any new symptoms such as vomiting, severe headache, stiff or painful neck, chest pain, shortness of breath, or trouble swallowing.  You develop severe throat pain, drooling, or changes in your voice.  You develop swelling of the neck, or the skin on the neck becomes red and tender.  You develop signs of dehydration, such as fatigue, dry mouth, and decreased urination.  You become increasingly sleepy, or you cannot wake up completely. MAKE SURE YOU:  Understand these instructions.  Will watch your condition.  Will get help right away if you are not doing well or get worse. Document Released: 09/09/2000 Document Revised: 01/27/2014 Document Reviewed: 11/11/2010 ExitCare  Patient Information 2015 [employer]. This information is not intended to replace advice given to you by your health care provider. Make sure you discuss any questions you have with your health care provider.  

## 2015-06-21 ENCOUNTER — Emergency Department (HOSPITAL_COMMUNITY)
Admission: EM | Admit: 2015-06-21 | Discharge: 2015-06-21 | Disposition: A | Discharge status: Home / Self Care | Payer: Medicaid Other | Source: Home / Self Care

## 2015-06-21 ENCOUNTER — Encounter (HOSPITAL_COMMUNITY): Payer: Self-pay | Admitting: Emergency Medicine

## 2015-06-21 DIAGNOSIS — R5383 Other fatigue: Secondary | ICD-10-CM | POA: Diagnosis not present

## 2015-06-21 DIAGNOSIS — R42 Dizziness and giddiness: Secondary | ICD-10-CM | POA: Diagnosis not present

## 2015-06-21 DIAGNOSIS — J029 Acute pharyngitis, unspecified: Secondary | ICD-10-CM

## 2015-06-21 LAB — TSH: TSH: 1.446 u[IU]/mL (ref 0.350–4.500)

## 2015-06-21 LAB — CBC
HCT: 38.6 % (ref 36.0–46.0)
Hemoglobin: 13 g/dL (ref 12.0–15.0)
MCH: 30.9 pg (ref 26.0–34.0)
MCHC: 33.7 g/dL (ref 30.0–36.0)
MCV: 91.7 fL (ref 78.0–100.0)
PLATELETS: 175 10*3/uL (ref 150–400)
RBC: 4.21 MIL/uL (ref 3.87–5.11)
RDW: 13.2 % (ref 11.5–15.5)
WBC: 7 10*3/uL (ref 4.0–10.5)

## 2015-06-21 LAB — POCT PREGNANCY, URINE: Preg Test, Ur: NEGATIVE

## 2015-06-21 LAB — POCT RAPID STREP A: Streptococcus, Group A Screen (Direct): NEGATIVE

## 2015-06-21 MED ORDER — IBUPROFEN 400 MG PO TABS: 400.0000 mg | ORAL_TABLET | Freq: Four times a day (QID) | ORAL | Status: DC | PRN

## 2015-06-21 NOTE — Discharge Instructions (Signed)

## 2015-06-21 NOTE — ED Notes (Signed)
The patient presented to the [facility] with a complaint of sore throat and occasional fatigue and dizziness. The patient stated that she was diagnosed with and prescribed antibiotics for strep throat about 2 weeks ago. She stated that the sore throat did get better upon completion of the antibiotics but then after an additional week the sore throat returned. The patient also requested a urine pregnancy test.

## 2015-06-21 NOTE — ED Provider Notes (Addendum)
CSN: [id number]     Arrival date & time 06/21/15  1702 History   None    Chief Complaint  Patient presents with  . Sore Throat  . Fatigue   (Consider location/radiation/quality/duration/timing/severity/associated sxs/prior Treatment) Patient is a 44 y.o. female presenting with pharyngitis and dizziness. The history is provided by the patient. No language interpreter was used.  Sore Throat This is a new problem. Episode onset: She had positive strep throat 2 wks ago and she completed her course of Amoxicillin. Pain resolved and then she started having symptoms again1 wk ago. The problem occurs constantly. The problem has been gradually improving. Pertinent negatives include no chest pain, no abdominal pain, no headaches and no shortness of breath. Nothing aggravates the symptoms. Nothing relieves the symptoms. She has tried nothing for the symptoms.  Dizziness Quality:  Lightheadedness Severity:  Moderate (x 4 days) Onset quality:  Gradual Duration:  4 days (She has been weak for few weeks but started with dizziness 4 days ago.) Progression:  Waxing and waning Context: not with bowel movement, not with eye movement, not with loss of consciousness, not with medication and not when urinating   Relieved by: rest. Worsened by:  Movement Associated symptoms: weakness   Associated symptoms: no chest pain, no headaches, no shortness of breath, no syncope, no vision changes and no vomiting   Associated symptoms comment:  She is sexually active, she has IUD in place but worried she might be pregnant. Denies any bleeding. Since she has IUD in place she will only spot blood.   Past Medical History  Diagnosis Date  . No pertinent past medical history   . Uterine fibroid    Past Surgical History  Procedure Laterality Date  . Nasal polyp excision    . Wisdom tooth extraction     Family History  Problem Relation Age of Onset  . Hypertension Mother    Social History  Substance Use Topics  .  Smoking status: Never Smoker   . Smokeless tobacco: Never Used  . Alcohol Use: No   OB History    Gravida Para Term Preterm AB TAB SAB Ectopic Multiple Living   3 3 3  0 0 0 0 0 0 3     Review of Systems  Respiratory: Negative.  Negative for shortness of breath.   Cardiovascular: Negative.  Negative for chest pain and syncope.  Gastrointestinal: Negative.  Negative for vomiting and abdominal pain.  Genitourinary: Negative.   Neurological: Positive for dizziness, weakness and light-headedness. Negative for headaches.  All other systems reviewed and are negative.   Allergies  Review of patient's allergies indicates no known allergies.  Home Medications   Prior to Admission medications   Not on File   Meds Ordered and Administered this Visit  Medications - No data to display  BP 149/87 mmHg  Pulse 98  Temp(Src) 98.3 F (36.8 C) (Oral)  Resp 20  SpO2 99%  LMP 05/18/2015 No data found.   Physical Exam  Constitutional: She is oriented to person, place, and time. She appears well-developed. No distress.  HENT:  Right Ear: Tympanic membrane and ear canal normal.  Left Ear: Tympanic membrane and ear canal normal.  Mouth/Throat: Uvula is midline, oropharynx is clear and moist and mucous membranes are normal.  Cardiovascular: Normal rate, regular rhythm, normal heart sounds and intact distal pulses.   No murmur heard. Pulmonary/Chest: Effort normal and breath sounds normal. No respiratory distress. She has no wheezes.  Abdominal: Soft. Bowel sounds are  normal. She exhibits no distension and no mass.  Musculoskeletal: Normal range of motion. She exhibits no edema.  Neurological: She is alert and oriented to person, place, and time.  Nursing note and vitals reviewed.   ED Course  Procedures (including critical care time)  Labs Review Labs Reviewed - No data to display  Imaging Review No results found.   Visual Acuity Review  Right Eye Distance:   Left Eye  Distance:   Bilateral Distance:    Right Eye Near:   Left Eye Near:    Bilateral Near:      Orthostatic BP Check:  Lying:  BP: 100/70 HR 93 Sitting: 110/62        93 Standing: 125/91   HR 93   MDM  No diagnosis found. Pharyngitis Dizziness Fatigue   Patient reassured she does not need any more antibiotic. Strep test repeated was negative. I recommended warm saline gurgle and ibuprofen as needed for pain. If no improvement in few days or weeks she will need ENT assessment. She agreed with plan.  Dizziness may be related to stress. She is currently asymptomatic. Pregnancy test is negative. CBC checked to r/o anemia. TSH checked for fatigue. Orthostatic BP check was normal. Rest at home recommended for now pending result. She is advised to establish care with a PCP for chronic fatigue.    Kinnie Feil, MD 06/21/15 [phone]

## 2015-06-22 ENCOUNTER — Ambulatory Visit (INDEPENDENT_AMBULATORY_CARE_PROVIDER_SITE_OTHER): Payer: BLUE CROSS/BLUE SHIELD | Admitting: Urgent Care

## 2015-06-22 VITALS — BP 110/80 | HR 87 | Temp 99.0°F | Resp 16 | Ht 60.0 in | Wt 154.8 lb

## 2015-06-22 DIAGNOSIS — J029 Acute pharyngitis, unspecified: Secondary | ICD-10-CM

## 2015-06-22 LAB — POCT CBC
Granulocyte percent: 81.8 %G — AB (ref 37–80)
HCT, POC: 40.8 % (ref 37.7–47.9)
HEMOGLOBIN: 12.9 g/dL (ref 12.2–16.2)
LYMPH, POC: 0.9 (ref 0.6–3.4)
MCH, POC: 28.6 pg (ref 27–31.2)
MCHC: 31.7 g/dL — AB (ref 31.8–35.4)
MCV: 90.1 fL (ref 80–97)
MID (cbc): 0.3 (ref 0–0.9)
MPV: 6.5 fL (ref 0–99.8)
PLATELET COUNT, POC: 184 10*3/uL (ref 142–424)
POC Granulocyte: 5.3 (ref 2–6.9)
POC LYMPH PERCENT: 13.3 %L (ref 10–50)
POC MID %: 4.9 %M (ref 0–12)
RBC: 4.53 M/uL (ref 4.04–5.48)
RDW, POC: 14.2 %
WBC: 6.5 10*3/uL (ref 4.6–10.2)

## 2015-06-22 LAB — POCT RAPID STREP A (OFFICE): Rapid Strep A Screen: NEGATIVE

## 2015-06-22 MED ORDER — HYDROCODONE-HOMATROPINE 5-1.5 MG/5ML PO SYRP: 5.0000 mL | ORAL_SOLUTION | Freq: Every evening | ORAL | Status: DC | PRN

## 2015-06-22 NOTE — Patient Instructions (Addendum)
-   You can take Zyrtec (cetirizine) and Sudafed (pseudoephedrine) for post-nasal drainage.   Pharyngitis Pharyngitis is redness, pain, and swelling (inflammation) of your pharynx.  CAUSES  Pharyngitis is usually caused by infection. Most of the time, these infections are from viruses (viral) and are part of a cold. However, sometimes pharyngitis is caused by bacteria (bacterial). Pharyngitis can also be caused by allergies. Viral pharyngitis may be spread from person to person by coughing, sneezing, and personal items or utensils (cups, forks, spoons, toothbrushes). Bacterial pharyngitis may be spread from person to person by more intimate contact, such as kissing.  SIGNS AND SYMPTOMS  Symptoms of pharyngitis include:   Sore throat.   Tiredness (fatigue).   Low-grade fever.   Headache.  Joint pain and muscle aches.  Skin rashes.  Swollen lymph nodes.  Plaque-like film on throat or tonsils (often seen with bacterial pharyngitis). DIAGNOSIS  Your health care provider will ask you questions about your illness and your symptoms. Your medical history, along with a physical exam, is often all that is needed to diagnose pharyngitis. Sometimes, a rapid strep test is done. Other lab tests may also be done, depending on the suspected cause.  TREATMENT  Viral pharyngitis will usually get better in 3-4 days without the use of medicine. Bacterial pharyngitis is treated with medicines that kill germs (antibiotics).  HOME CARE INSTRUCTIONS   Drink enough water and fluids to keep your urine clear or pale yellow.   Only take over-the-counter or prescription medicines as directed by your health care provider:   If you are prescribed antibiotics, make sure you finish them even if you start to feel better.   Do not take aspirin.   Get lots of rest.   Gargle with 8 oz of salt water ( tsp of salt per 1 qt of water) as often as every 1-2 hours to soothe your throat.   Throat lozenges (if  you are not at risk for choking) or sprays may be used to soothe your throat. SEEK MEDICAL CARE IF:   You have large, tender lumps in your neck.  You have a rash.  You cough up green, yellow-brown, or bloody spit. SEEK IMMEDIATE MEDICAL CARE IF:   Your neck becomes stiff.  You drool or are unable to swallow liquids.  You vomit or are unable to keep medicines or liquids down.  You have severe pain that does not go away with the use of recommended medicines.  You have trouble breathing (not caused by a stuffy nose). MAKE SURE YOU:   Understand these instructions.  Will watch your condition.  Will get help right away if you are not doing well or get worse. Document Released: 09/12/2005 Document Revised: 07/03/2013 Document Reviewed: 05/20/2013 [facility] Patient Information 2015 [place], [place]. This information is not intended to replace advice given to you by your health care provider. Make sure you discuss any questions you have with your health care provider.

## 2015-06-22 NOTE — Progress Notes (Signed)
    MRN: [id number] DOB: 1971-01-09  Subjective:   Amy Santana is a 44 y.o. female presenting for chief complaint of Dizziness and Sore Throat  Reports 3 day history of worsening sore throat, subjective fever, malaise and fatigue. Has tried Alleve once without any relief. Denies fever, itchy eyes, red eyes, eye pain, ear pain, ear drainage, sinus pain, congestion, cough, shortness of breath, chest pain, chest tightness, nausea, vomiting, abdominal pain. Denies smoking cigarettes. Denies history of seasonal allergies. Denies any other aggravating or relieving factors, no other questions or concerns.  Amy Santana currently has no medications in their medication list. Also has No Known Allergies.  Amy Santana  has a past medical history of No pertinent past medical history and Uterine fibroid. Also  has past surgical history that includes Nasal polyp excision and Wisdom tooth extraction.  Objective:   Vitals: BP 110/80 mmHg  Pulse 87  Temp(Src) 99 F (37.2 C) (Oral)  Resp 16  Ht 5' (1.524 m)  Wt 154 lb 12.8 oz (70.217 kg)  BMI 30.23 kg/m2  SpO2 96%  LMP 05/14/2015  Temp 98.80F on recheck by PA-Mani  Physical Exam  Constitutional: She is oriented to person, place, and time. She appears well-developed and well-nourished.  HENT:  TM's intact bilaterally, no effusions or erythema. Nasal turbinates pink and moist. No sinus tenderness. Postnasal drip present, without oropharyngeal exudates, erythema or abscesses.  Eyes: Conjunctivae are normal. No scleral icterus.  Neck: Normal range of motion. Neck supple.  Cardiovascular: Normal rate, regular rhythm and intact distal pulses.  Exam reveals no gallop and no friction rub.   No murmur heard. Pulmonary/Chest: No respiratory distress. She has no wheezes. She has no rales.  Lymphadenopathy:    She has no cervical adenopathy.  Neurological: She is alert and oriented to person, place, and time.  Skin: Skin is warm and dry. No rash noted.  No erythema. No pallor.   Results for orders placed or performed in visit on 06/22/15 (from the past 24 hour(s))  POCT rapid strep A     Status: None   Collection Time: 06/22/15 10:39 AM  Result Value Ref Range   Rapid Strep A Screen Negative Negative  POCT CBC     Status: Abnormal   Collection Time: 06/22/15 10:39 AM  Result Value Ref Range   WBC 6.5 4.6 - 10.2 K/uL   Lymph, poc 0.9 0.6 - 3.4   POC LYMPH PERCENT 13.3 10 - 50 %L   MID (cbc) 0.3 0 - 0.9   POC MID % 4.9 0 - 12 %M   POC Granulocyte 5.3 2 - 6.9   Granulocyte percent 81.8 (A) 37 - 80 %G   RBC 4.53 4.04 - 5.48 M/uL   Hemoglobin 12.9 12.2 - 16.2 g/dL   HCT, POC 40.8 37.7 - 47.9 %   MCV 90.1 80 - 97 fL   MCH, POC 28.6 27 - 31.2 pg   MCHC 31.7 (A) 31.8 - 35.4 g/dL   RDW, POC 14.2 %   Platelet Count, POC 184 142 - 424 K/uL   MPV 6.5 0 - 99.8 fL   Assessment and Plan :   1. Pharyngitis 2. Sore throat - Likely viral pharyngitis, strep culture pending. We'll manage symptomatically for now, start antibiotic course if the culture is positive.  UPDATE: Strep culture was positive, sent prescription to her pharmacy for Augmentin. Patient is aware.  Jaynee Eagles, PA-C [facility] [phone] 06/22/2015 10:08 AM

## 2015-06-23 ENCOUNTER — Other Ambulatory Visit: Payer: Self-pay | Admitting: Urgent Care

## 2015-06-23 DIAGNOSIS — J020 Streptococcal pharyngitis: Secondary | ICD-10-CM

## 2015-06-23 LAB — EPSTEIN-BARR VIRUS VCA ANTIBODY PANEL
EBV EA IgG: 12.1 U/mL — ABNORMAL HIGH (ref <9.0)
EBV NA IgG: 342 U/mL — ABNORMAL HIGH (ref <18.0)
EBV VCA IgM: <10 U/mL (ref <36.0)

## 2015-06-23 LAB — CULTURE, GROUP A STREP: STREP A CULTURE: POSITIVE — AB

## 2015-06-23 MED ORDER — AMOXICILLIN-POT CLAVULANATE 875-125 MG PO TABS: 1.0000 | ORAL_TABLET | Freq: Two times a day (BID) | ORAL | Status: DC

## 2015-07-15 NOTE — Progress Notes (Signed)
  Medical screening examination/treatment/procedure(s) were performed by non-physician practitioner and as supervising physician I was immediately available for consultation/collaboration.     

## 2015-07-15 NOTE — Addendum Note (Signed)
Addended by: Roselee Culver on: 07/15/2015 04:26 PM   Modules accepted: Miquel Dunn

## 2015-07-27 ENCOUNTER — Ambulatory Visit (INDEPENDENT_AMBULATORY_CARE_PROVIDER_SITE_OTHER): Payer: BLUE CROSS/BLUE SHIELD | Admitting: Obstetrics

## 2015-07-27 ENCOUNTER — Ambulatory Visit: Payer: BLUE CROSS/BLUE SHIELD | Admitting: Obstetrics

## 2015-07-27 ENCOUNTER — Encounter: Payer: Self-pay | Admitting: Obstetrics

## 2015-07-27 VITALS — BP 126/80 | HR 93 | Temp 98.2°F | Wt 160.0 lb

## 2015-07-27 DIAGNOSIS — F329 Major depressive disorder, single episode, unspecified: Secondary | ICD-10-CM

## 2015-07-27 DIAGNOSIS — Z3040 Encounter for surveillance of contraceptives, unspecified: Secondary | ICD-10-CM | POA: Diagnosis not present

## 2015-07-27 DIAGNOSIS — F4321 Adjustment disorder with depressed mood: Secondary | ICD-10-CM
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
## 2015-09-03 ENCOUNTER — Ambulatory Visit (INDEPENDENT_AMBULATORY_CARE_PROVIDER_SITE_OTHER): Payer: BLUE CROSS/BLUE SHIELD | Admitting: Family Medicine

## 2015-09-03 VITALS — BP 120/80 | HR 97 | Temp 98.2°F | Resp 17 | Ht 61.0 in | Wt 156.2 lb

## 2015-09-03 DIAGNOSIS — M779 Enthesopathy, unspecified: Secondary | ICD-10-CM

## 2015-09-03 DIAGNOSIS — M79671 Pain in right foot: Secondary | ICD-10-CM | POA: Diagnosis not present

## 2015-09-03 MED ORDER — MELOXICAM 7.5 MG PO TABS: 7.5000 mg | ORAL_TABLET | Freq: Every day | ORAL | Status: DC

## 2015-09-03 NOTE — Progress Notes (Signed)
   Subjective:    Patient ID: Amy Santana, female    DOB: 08-24-1971, 44 y.o.   MRN: [id number] By signing my name below, I, Zola Button, attest that this documentation has been prepared under the direction and in the presence of Robyn Haber, MD.  Electronically Signed: Zola Button, Medical Scribe. 09/03/2015. 5:07 PM.  HPI HPI Comments: Amy Santana is a 44 y.o. female who presents to the [facility] complaining of intermittent right foot pain to the top of her foot that started about 3 weeks ago, but worsened yesterday. She has not tried anything for the pain. Patient denies falls and injury. She notes that she is on her feet all day at work. She did not work today and notes that her foot pain has improved some. She is supposed to return to work tomorrow.  Patient works at [employer] with [employer].  Review of Systems  Musculoskeletal: Positive for arthralgias.       Objective:   Physical Exam  EXTREMITIES: Foot exam is normal. Dorsalis pedis pulses present, there is no bony abnormality, there is no soft tissue swelling, there is no discoloration, there is normal passive and active range of motion         Assessment & Plan:   This chart was scribed in my presence and reviewed by me personally.    ICD-9-CM ICD-10-CM   1. Right foot pain 729.5 M79.671   2. Tendinitis 726.90 M77.9      Signed, Robyn Haber, MD

## 2015-09-03 NOTE — Patient Instructions (Signed)
Please return if is not improving over the next 1-2 weeks.

## 2015-09-29 ENCOUNTER — Ambulatory Visit (INDEPENDENT_AMBULATORY_CARE_PROVIDER_SITE_OTHER): Payer: BLUE CROSS/BLUE SHIELD | Admitting: Physician Assistant

## 2015-09-29 VITALS — BP 128/80 | HR 97 | Temp 98.2°F | Resp 20 | Ht 62.0 in | Wt 156.6 lb

## 2015-09-29 DIAGNOSIS — H578 Other specified disorders of eye and adnexa: Secondary | ICD-10-CM | POA: Diagnosis not present

## 2015-09-29 DIAGNOSIS — H579 Unspecified disorder of eye and adnexa: Secondary | ICD-10-CM | POA: Diagnosis not present

## 2015-09-29 DIAGNOSIS — H5789 Other specified disorders of eye and adnexa: Secondary | ICD-10-CM

## 2015-09-29 LAB — POCT SEDIMENTATION RATE: POCT SED RATE: 25 mm/h — AB (ref 0–22)

## 2015-09-29 MED ORDER — POLYMYXIN B-TRIMETHOPRIM 10000-0.1 UNIT/ML-% OP SOLN: 1.0000 [drp] | OPHTHALMIC | Status: DC

## 2015-09-29 NOTE — Progress Notes (Signed)
09/29/2015 6:19 PM   DOB: [birth year] / MRN: [id number]  SUBJECTIVE:  Amy Santana is a 45 y.o. female presenting for a right red eye that started today.  She reports she has also had a headache the last three days that she describes as frontal, 6/10, and non pulsatile in nature.  She does feel that something is in the eye.  She denies nausea, photophobia, and frank eye pain.  She does reports a foreign body sensation and thinks that her baby may have hit her in the eye last night while sleeping.     She has No Known Allergies.   She  has a past medical history of No pertinent past medical history and Uterine fibroid.    She  reports that she has never smoked. She has never used smokeless tobacco. She reports that she does not drink alcohol or use illicit drugs. She  reports that she currently engages in sexual activity and has had female partners. She reports using the following methods of birth control/protection: Condom and IUD. The patient  has past surgical history that includes Nasal polyp excision and Wisdom tooth extraction.  Her family history includes Hypertension in her mother.  Review of Systems  Constitutional: Negative for fever and chills.  Eyes: Positive for redness. Negative for blurred vision, double vision, photophobia, pain and discharge.  Respiratory: Negative for cough and shortness of breath.   Cardiovascular: Negative for chest pain.  Gastrointestinal: Negative for nausea and abdominal pain.  Genitourinary: Negative for dysuria, urgency and frequency.  Musculoskeletal: Negative for myalgias.  Skin: Negative for rash.  Neurological: Negative for dizziness, tingling and headaches.  Psychiatric/Behavioral: Negative for depression. The patient is not nervous/anxious.     Problem list and medications reviewed and updated by myself where necessary, and exist elsewhere in the encounter.   OBJECTIVE:  BP 128/80 mmHg  Pulse 97  Temp(Src) 98.2 F (36.8 C) (Oral)   Resp 20  Ht 5\' 2"  (1.575 m)  Wt 156 lb 9.6 oz (71.033 kg)  BMI 28.64 kg/m2  SpO2 98%  Physical Exam  Constitutional: She is oriented to person, place, and time. She appears well-nourished. No distress.  Eyes: EOM are normal. Pupils are equal, round, and reactive to light.  Slit lamp exam:      The right eye shows no corneal abrasion, no corneal flare, no corneal ulcer, no foreign body, no hyphema and no hypopyon.       The left eye shows no corneal abrasion, no corneal flare, no corneal ulcer, no foreign body, no hyphema and no hypopyon.    Cardiovascular: Normal rate.   Pulmonary/Chest: Effort normal.  Abdominal: She exhibits no distension.  Neurological: She is alert and oriented to person, place, and time. No cranial nerve deficit. Gait normal.  Skin: Skin is dry. She is not diaphoretic.  Psychiatric: She has a normal mood and affect.  Vitals reviewed.   No results found for this or any previous visit (from the past 48 hour(s)).   Visual Acuity Screening   Right eye Left eye Both eyes  Without correction: 20/25-1 20/20-2 20/15-2  With correction:        ASSESSMENT AND PLAN  Amy Santana was seen today for eye injury.  Diagnoses and all orders for this visit:  Redness of eye, right: No temporal tenderness.  Eye exam reassuring and negative for foreign body or scratch. Given HA will run a sed rate to rule out temporal arteritis.   -  POCT SEDIMENTATION RATE  Sensation of foreign body in eye -     trimethoprim-polymyxin b (POLYTRIM) ophthalmic solution; Place 1 drop into the right eye every 4 (four) hours.    The patient was advised to call or return to clinic if she does not see an improvement in symptoms or to seek the care of the closest emergency department if she worsens with the above plan.   Philis Fendt, MHS, PA-C [facility] 09/29/2015 6:19 PM

## 2015-11-20 ENCOUNTER — Ambulatory Visit (INDEPENDENT_AMBULATORY_CARE_PROVIDER_SITE_OTHER): Payer: BLUE CROSS/BLUE SHIELD | Admitting: Physician Assistant

## 2015-11-20 VITALS — BP 110/74 | HR 90 | Temp 98.5°F | Resp 16 | Ht 60.5 in | Wt 156.8 lb

## 2015-11-20 DIAGNOSIS — J0101 Acute recurrent maxillary sinusitis: Secondary | ICD-10-CM | POA: Diagnosis not present

## 2015-11-20 DIAGNOSIS — J339 Nasal polyp, unspecified: Secondary | ICD-10-CM | POA: Diagnosis not present

## 2015-11-20 MED ORDER — AMOXICILLIN-POT CLAVULANATE 875-125 MG PO TABS: 1.0000 | ORAL_TABLET | Freq: Two times a day (BID) | ORAL | Status: AC

## 2015-11-20 MED ORDER — FLUTICASONE PROPIONATE 50 MCG/ACT NA SUSP: 2.0000 | Freq: Every day | NASAL | Status: DC

## 2015-11-20 NOTE — Progress Notes (Signed)
Patient ID: Amy Santana, female    DOB: 31-May-1971, 45 y.o.   MRN: [id number]  PCP: Yvetta Coder  Subjective:   Chief Complaint  Patient presents with  . nasal congestion    x 1 week    HPI Presents for evaluation of possible sinusitis.  She has a 16-year history of nasal polyps, s/p surgery, but recurrent polyp growth, with episodes of increased congestion, drainage and sinusitis every couple of years. Has required oral steroids in addition to antibiotics sometimes. Does not use a nasal steroid spray.  No fever, chills. No GI/GU symptoms. No sore throat or ear pain. No headache, dizziness. No myalgias/arthralgias.    Review of Systems As above.    Patient Active Problem List   Diagnosis Date Noted  . Seasonal allergic rhinitis 01/17/2013  . GERD without esophagitis 01/17/2013  . Allergic rhinitis, seasonal 01/17/2013  . Nasal polyp 01/04/2013     Prior to Admission medications   Not on File     No Known Allergies     Objective:  Physical Exam  Constitutional: She is oriented to person, place, and time. She appears well-developed and well-nourished. No distress.  BP 110/74 mmHg  Pulse 90  Temp(Src) 98.5 F (36.9 C) (Oral)  Resp 16  Ht 5' 0.5" (1.537 m)  Wt 156 lb 12.8 oz (71.124 kg)  BMI 30.11 kg/m2  SpO2 98%   HENT:  Head: Normocephalic and atraumatic.  Right Ear: Hearing, tympanic membrane, external ear and ear canal normal.  Left Ear: Hearing, tympanic membrane, external ear and ear canal normal.  Nose: Mucosal edema (unable to see beyond the nasal turbinates which are very large, mildly erythematous) present.  No foreign bodies. Right sinus exhibits no maxillary sinus tenderness and no frontal sinus tenderness. Left sinus exhibits maxillary sinus tenderness. Left sinus exhibits no frontal sinus tenderness.  Mouth/Throat: Uvula is midline, oropharynx is clear and moist and mucous membranes are normal. No oral lesions. No uvula  swelling. No oropharyngeal exudate.  Eyes: Conjunctivae and EOM are normal. Pupils are equal, round, and reactive to light. Right eye exhibits no discharge. Left eye exhibits no discharge. No scleral icterus.  Neck: Trachea normal, normal range of motion and full passive range of motion without pain. Neck supple. No thyroid mass and no thyromegaly present.  Cardiovascular: Normal rate, regular rhythm and normal heart sounds.   Pulmonary/Chest: Effort normal and breath sounds normal.  Lymphadenopathy:       Head (right side): No submandibular, no tonsillar, no preauricular, no posterior auricular and no occipital adenopathy present.       Head (left side): No submandibular, no tonsillar, no preauricular and no occipital adenopathy present.    She has no cervical adenopathy.       Right: No supraclavicular adenopathy present.       Left: No supraclavicular adenopathy present.  Neurological: She is alert and oriented to person, place, and time. She has normal strength. No cranial nerve deficit or sensory deficit.  Skin: Skin is warm, dry and intact. No rash noted.  Psychiatric: She has a normal mood and affect. Her speech is normal and behavior is normal.           Assessment & Plan:   1. Nasal polyp Not interested in seeing ENT again at present. Encouraged daily nasal steroid use. - fluticasone (FLONASE) 50 MCG/ACT nasal spray; Place 2 sprays into both nostrils daily.  Dispense: 16 g; Refill: 12  2. Acute recurrent maxillary sinusitis Rest. Fluids. Augmentin.  If symptoms not resolved when she completes the antibiotic course, she may need a course of oral steroids. - amoxicillin-clavulanate (AUGMENTIN) 875-125 MG tablet; Take 1 tablet by mouth 2 (two) times daily.  Dispense: 20 tablet; Refill: 0   Fara Chute, PA-C Physician Assistant-Certified [facility]

## 2015-11-20 NOTE — Patient Instructions (Signed)
Complete the antibiotic for infection. If the symptoms are not better when you complete it, let me know-we may need to prescribe prednisone.  Please use the Nasal Spray EVERY DAY to help prevent this from happening again.

## 2016-02-19 ENCOUNTER — Other Ambulatory Visit: Payer: Self-pay | Admitting: Obstetrics

## 2016-02-24 ENCOUNTER — Other Ambulatory Visit: Payer: Self-pay | Admitting: Obstetrics

## 2016-02-26 ENCOUNTER — Other Ambulatory Visit: Payer: Self-pay | Admitting: Obstetrics

## 2016-02-29 ENCOUNTER — Telehealth: Payer: Self-pay | Admitting: *Deleted

## 2016-02-29 ENCOUNTER — Other Ambulatory Visit: Payer: Self-pay | Admitting: Obstetrics

## 2016-02-29 ENCOUNTER — Other Ambulatory Visit: Payer: Self-pay | Admitting: Physician Assistant

## 2016-02-29 ENCOUNTER — Ambulatory Visit (INDEPENDENT_AMBULATORY_CARE_PROVIDER_SITE_OTHER): Payer: BLUE CROSS/BLUE SHIELD | Admitting: Family Medicine

## 2016-02-29 VITALS — BP 116/78 | HR 78 | Temp 98.3°F | Resp 18 | Ht 60.5 in | Wt 163.8 lb

## 2016-02-29 DIAGNOSIS — R42 Dizziness and giddiness: Secondary | ICD-10-CM | POA: Diagnosis not present

## 2016-02-29 DIAGNOSIS — R6889 Other general symptoms and signs: Secondary | ICD-10-CM

## 2016-02-29 DIAGNOSIS — R635 Abnormal weight gain: Secondary | ICD-10-CM

## 2016-02-29 DIAGNOSIS — Z862 Personal history of diseases of the blood and blood-forming organs and certain disorders involving the immune mechanism: Secondary | ICD-10-CM

## 2016-02-29 DIAGNOSIS — R1013 Epigastric pain: Secondary | ICD-10-CM | POA: Diagnosis not present

## 2016-02-29 LAB — COMPLETE METABOLIC PANEL WITH GFR
ALK PHOS: 61 U/L (ref 33–115)
ALT: 18 U/L (ref 6–29)
AST: 22 U/L (ref 10–30)
Albumin: 4.2 g/dL (ref 3.6–5.1)
BILIRUBIN TOTAL: 0.3 mg/dL (ref 0.2–1.2)
BUN: 11 mg/dL (ref 7–25)
CALCIUM: 9.1 mg/dL (ref 8.6–10.2)
CO2: 29 mmol/L (ref 20–31)
CREATININE: 0.65 mg/dL (ref 0.50–1.10)
Chloride: 101 mmol/L (ref 98–110)
Glucose, Bld: 82 mg/dL (ref 65–99)
Potassium: 3.5 mmol/L (ref 3.5–5.3)
Sodium: 139 mmol/L (ref 135–146)
TOTAL PROTEIN: 7.2 g/dL (ref 6.1–8.1)

## 2016-02-29 LAB — POCT CBC
Granulocyte percent: 60.8 %G (ref 37–80)
HEMATOCRIT: 39.3 % (ref 37.7–47.9)
Hemoglobin: 13.7 g/dL (ref 12.2–16.2)
LYMPH, POC: 1.4 (ref 0.6–3.4)
MCH: 31.1 pg (ref 27–31.2)
MCHC: 35 g/dL (ref 31.8–35.4)
MCV: 88.7 fL (ref 80–97)
MID (CBC): 0.2 (ref 0–0.9)
MPV: 7.9 fL (ref 0–99.8)
POC GRANULOCYTE: 2.4 (ref 2–6.9)
POC LYMPH PERCENT: 33.9 %L (ref 10–50)
POC MID %: 5.3 % (ref 0–12)
Platelet Count, POC: 163 10*3/uL (ref 142–424)
RBC: 4.43 M/uL (ref 4.04–5.48)
RDW, POC: 12.9 %
WBC: 4 10*3/uL — AB (ref 4.6–10.2)

## 2016-02-29 LAB — GLUCOSE, POCT (MANUAL RESULT ENTRY): POC GLUCOSE: 84 mg/dL (ref 70–99)

## 2016-02-29 NOTE — Progress Notes (Addendum)
Subjective:  By signing my name below, I, Raven Small, attest that this documentation has been prepared under the direction and in the presence of Merri Ray, MD.  Electronically Signed: Thea Alken, ED Scribe. 02/29/2016. 2:44 PM.   Patient ID: Amy Santana, female    DOB: [birth year], 45 y.o.   MRN: [id number]  HPI Chief Complaint  Patient presents with  . Dizziness    x 5 days  . Fatigue    x 5 days    HPI Comments: Amy Santana is a 45 y.o. female who presents to the [facility] complaining of dizziness and fatigue that began 5 days ago. She has hx of allergic rhinitis, GERD. Pt reports dizziness when she is up and moving around but not with head movement. She also reports associated fatigue, becoming cold easily and weight gain for the past 2 months. Pt reports hx of iron deficiency, about 7-8 years ago and has taken iron supplements in the past. LMP- 6/2 which was normal. She denies break through bleeding. She has hx of fibroids and heavy bleeding. She urinates 3 times a day. She denies recent travel including on plane, train or boat. She denies CP, palpitation, visual changes, slurred speech, focal weakness, facial droop, sinus pressure, nausea and emesis.   Pt works at [employer].  Pt is from [place] and has been in [place] for   Patient Active Problem List   Diagnosis Date Noted  . Seasonal allergic rhinitis 01/17/2013  . GERD without esophagitis 01/17/2013  . Allergic rhinitis, seasonal 01/17/2013  . Nasal polyp 01/04/2013   Past Medical History  Diagnosis Date  . No pertinent past medical history   . Uterine fibroid    Past Surgical History  Procedure Laterality Date  . Nasal polyp excision    . Wisdom tooth extraction     No Known Allergies Prior to Admission medications   Medication Sig Start Date End Date Taking? Authorizing Provider  fluticasone (FLONASE) 50 MCG/ACT nasal spray Place 2 sprays into both nostrils daily. 11/20/15  Yes  Harrison Mons, PA-C   Social History   Social History  . Marital Status: Legally Separated    Spouse Name: Elisabeth Cara Oppong-Agyare  . Number of Children: 2  . Years of Education: N/A   Occupational History  . Quality control specialist    Social History Main Topics  . Smoking status: Never Smoker   . Smokeless tobacco: Never Used  . Alcohol Use: No  . Drug Use: No  . Sexual Activity:    Partners: Male    Birth Control/ Protection: Condom, IUD   Other Topics Concern  . Not on file   Social History Narrative   From [place].   Came to the [place] at age 81.   Lives with her mother and her 3 children.   Separated from her husband.   Review of Systems  Constitutional: Positive for fatigue and unexpected weight change.  HENT: Negative for sinus pressure.   Eyes: Negative for visual disturbance.  Respiratory: Negative for shortness of breath.   Cardiovascular: Negative for chest pain and palpitations.  Gastrointestinal: Positive for abdominal pain. Negative for nausea and vomiting.  Genitourinary: Negative for menstrual problem.  Neurological: Positive for dizziness. Negative for facial asymmetry, speech difficulty and weakness.    Objective:   Physical Exam  Constitutional: She is oriented to person, place, and time. She appears well-developed and well-nourished. No distress.  HENT:  Head: Normocephalic and atraumatic.  Minimal cerumen in  ears but non obstructive. TM are pearly grey  Eyes: Conjunctivae and EOM are normal.  Neck: Neck supple. No thyroid mass present.  No nodules on thyroid exam, possible slight fullness.   Cardiovascular: Normal rate.   Pulmonary/Chest: Effort normal.  Abdominal: There is tenderness ( minimal) in the epigastric area. There is no rebound and no guarding.  Musculoskeletal: Normal range of motion.  Neurological: She is alert and oriented to person, place, and time. She displays a negative Romberg sign.  Negative pronator drift. Normal finger to  nose.  Skin: Skin is warm and dry.  Psychiatric: She has a normal mood and affect. Her behavior is normal.  Nursing note and vitals reviewed.  Filed Vitals:   02/29/16 1225  BP: 116/78  Pulse: 78  Temp: 98.3 F (36.8 C)  TempSrc: Oral  Resp: 18  Height: 5' 0.5" (1.537 m)  Weight: 163 lb 12.8 oz (74.299 kg)  SpO2: 98%    Orthostatic VS for the past 24 hrs:  BP- Lying Pulse- Lying BP- Sitting Pulse- Sitting BP- Standing at 0 minutes Pulse- Standing at 0 minutes  02/29/16 1526 122/70 mmHg 82 118/76 mmHg 78 120/70 mmHg 78   Results for orders placed or performed in visit on 02/29/16  POCT glucose (manual entry)  Result Value Ref Range   POC Glucose 84 70 - 99 mg/dl  POCT CBC  Result Value Ref Range   WBC 4.0 (A) 4.6 - 10.2 K/uL   Lymph, poc 1.4 0.6 - 3.4   POC LYMPH PERCENT 33.9 10 - 50 %L   MID (cbc) 0.2 0 - 0.9   POC MID % 5.3 0 - 12 %M   POC Granulocyte 2.4 2 - 6.9   Granulocyte percent 60.8 37 - 80 %G   RBC 4.43 4.04 - 5.48 M/uL   Hemoglobin 13.7 12.2 - 16.2 g/dL   HCT, POC 39.3 37.7 - 47.9 %   MCV 88.7 80 - 97 fL   MCH, POC 31.1 27 - 31.2 pg   MCHC 35.0 31.8 - 35.4 g/dL   RDW, POC 12.9 %   Platelet Count, POC 163 142 - 424 K/uL   MPV 7.9 0 - 99.8 fL   EKG- sinus bradycardia, rate 60. No acute ST or T-wave changes apparent.   Assessment & Plan:  Amy Santana is a 45 y.o. female Dizziness - Plan: Orthostatic vital signs, POCT glucose (manual entry), POCT CBC, COMPLETE METABOLIC PANEL WITH GFR, TSH, EKG 12-Lead History of anemia Abdominal pain, epigastric - Plan: COMPLETE METABOLIC PANEL WITH GFR  - Overall reassuring exam, EKG, blood work and other testing in office. She is not orthostatic.  -Will check CMP, TSH.  -Increase fluid intake, regular meals, discussed adequate sleep, and if not improving in the next week, return to look into other causes. Sooner or ER if worse.  If any increasing abdominal pain, advised to return to clinic or ER if needed.  Cold  intolerance - Plan: TSH Weight gain - Plan: TSH  -Check TSH.     No orders of the defined types were placed in this encounter.   Patient Instructions       IF you received an x-ray today, you will receive an invoice from [facility]. Please contact [facility] at [phone] with questions or concerns regarding your invoice.   IF you received labwork today, you will receive an invoice from [employer]. Please contact [facility] at [phone] with questions or concerns regarding your invoice.  Our billing staff will not be able to assist you with questions regarding bills from these companies.  You will be contacted with the lab results as soon as they are available. The fastest way to get your results is to activate your My Chart account. Instructions are located on the last page of this paperwork. If you have not heard from [place] regarding the results in 2 weeks, please contact this office.    Your blood work, EKG, and other tests in the office looks normal and are reassuring.  I did send some other testing for your thyroid, kidney, liver tests.  Increase fluid intake, make sure you're eating regular meals throughout the day, snacks as needed, and make sure you're sleeping at least 7-8 hours per night. If these treatments do not help dizziness in the next week, or any worsening symptoms sooner, return for recheck. See other information on causes of dizziness below.  Return to the clinic or go to the nearest emergency room if any of your symptoms worsen or new symptoms occur.     Dizziness Dizziness is a common problem. It is a feeling of unsteadiness or light-headedness. You may feel like you are about to faint. Dizziness can lead to injury if you stumble or fall. Anyone can become dizzy, but dizziness is more common in older adults. This condition can be caused by a number of things, including medicines, dehydration, or illness. HOME CARE  INSTRUCTIONS Taking these steps may help with your condition: Eating and Drinking  Drink enough fluid to keep your urine clear or pale yellow. This helps to keep you from becoming dehydrated. Try to drink more clear fluids, such as water.  Do not drink alcohol.  Limit your caffeine intake if directed by your health care provider.  Limit your salt intake if directed by your health care provider. Activity  Avoid making quick movements.  Rise slowly from chairs and steady yourself until you feel okay.  In the morning, first sit up on the side of the bed. When you feel okay, stand slowly while you hold onto something until you know that your balance is fine.  Move your legs often if you need to stand in one place for a long time. Tighten and relax your muscles in your legs while you are standing.  Do not drive or operate heavy machinery if you feel dizzy.  Avoid bending down if you feel dizzy. Place items in your home so that they are easy for you to reach without leaning over. Lifestyle  Do not use any tobacco products, including cigarettes, chewing tobacco, or electronic cigarettes. If you need help quitting, ask your health care provider.  Try to reduce your stress level, such as with yoga or meditation. Talk with your health care provider if you need help. General Instructions  Watch your dizziness for any changes.  Take medicines only as directed by your health care provider. Talk with your health care provider if you think that your dizziness is caused by a medicine that you are taking.  Tell a friend or a family member that you are feeling dizzy. If he or she notices any changes in your behavior, have this person call your health care provider.  Keep all follow-up visits as directed by your health care provider. This is important. SEEK MEDICAL CARE IF:  Your dizziness does not go away.  Your dizziness or light-headedness gets worse.  You feel nauseous.  You have  reduced hearing.  You have new  symptoms.  You are unsteady on your feet or you feel like the room is spinning. SEEK IMMEDIATE MEDICAL CARE IF:  You vomit or have diarrhea and are unable to eat or drink anything.  You have problems talking, walking, swallowing, or using your arms, hands, or legs.  You feel generally weak.  You are not thinking clearly or you have trouble forming sentences. It may take a friend or family member to notice this.  You have chest pain, abdominal pain, shortness of breath, or sweating.  Your vision changes.  You notice any bleeding.  You have a headache.  You have neck pain or a stiff neck.  You have a fever.   This information is not intended to replace advice given to you by your health care provider. Make sure you discuss any questions you have with your health care provider.   Document Released: 03/08/2001 Document Revised: 01/27/2015 Document Reviewed: 09/08/2014 Elsevier Interactive Patient Education [employer].      I personally performed the services described in this documentation, which was scribed in my presence. The recorded information has been reviewed and considered, and addended by me as needed.   Signed,   Merri Ray, MD [facility].  02/29/2016 4:08 PM

## 2016-02-29 NOTE — Telephone Encounter (Signed)
[facility] is calling wanting an OK for refills of Mycolog and Terazol. Do you want to RF?

## 2016-02-29 NOTE — Patient Instructions (Addendum)
IF you received an x-ray today, you will receive an invoice from [facility]. Please contact [facility] at [phone] with questions or concerns regarding your invoice.   IF you received labwork today, you will receive an invoice from [employer]. Please contact [facility] at [phone] with questions or concerns regarding your invoice.   Our billing staff will not be able to assist you with questions regarding bills from these companies.  You will be contacted with the lab results as soon as they are available. The fastest way to get your results is to activate your My Chart account. Instructions are located on the last page of this paperwork. If you have not heard from [place] regarding the results in 2 weeks, please contact this office.    Your blood work, EKG, and other tests in the office looks normal and are reassuring.  I did send some other testing for your thyroid, kidney, liver tests.  Increase fluid intake, make sure you're eating regular meals throughout the day, snacks as needed, and make sure you're sleeping at least 7-8 hours per night. If these treatments do not help dizziness in the next week, or any worsening symptoms sooner, return for recheck. See other information on causes of dizziness below.  Return to the clinic or go to the nearest emergency room if any of your symptoms worsen or new symptoms occur.     Dizziness Dizziness is a common problem. It is a feeling of unsteadiness or light-headedness. You may feel like you are about to faint. Dizziness can lead to injury if you stumble or fall. Anyone can become dizzy, but dizziness is more common in older adults. This condition can be caused by a number of things, including medicines, dehydration, or illness. HOME CARE INSTRUCTIONS Taking these steps may help with your condition: Eating and Drinking  Drink enough fluid to keep your urine clear or pale yellow. This helps to  keep you from becoming dehydrated. Try to drink more clear fluids, such as water.  Do not drink alcohol.  Limit your caffeine intake if directed by your health care provider.  Limit your salt intake if directed by your health care provider. Activity  Avoid making quick movements.  Rise slowly from chairs and steady yourself until you feel okay.  In the morning, first sit up on the side of the bed. When you feel okay, stand slowly while you hold onto something until you know that your balance is fine.  Move your legs often if you need to stand in one place for a long time. Tighten and relax your muscles in your legs while you are standing.  Do not drive or operate heavy machinery if you feel dizzy.  Avoid bending down if you feel dizzy. Place items in your home so that they are easy for you to reach without leaning over. Lifestyle  Do not use any tobacco products, including cigarettes, chewing tobacco, or electronic cigarettes. If you need help quitting, ask your health care provider.  Try to reduce your stress level, such as with yoga or meditation. Talk with your health care provider if you need help. General Instructions  Watch your dizziness for any changes.  Take medicines only as directed by your health care provider. Talk with your health care provider if you think that your dizziness is caused by a medicine that you are taking.  Tell a friend or a family member that you are feeling dizzy. If he or she  notices any changes in your behavior, have this person call your health care provider.  Keep all follow-up visits as directed by your health care provider. This is important. SEEK MEDICAL CARE IF:  Your dizziness does not go away.  Your dizziness or light-headedness gets worse.  You feel nauseous.  You have reduced hearing.  You have new symptoms.  You are unsteady on your feet or you feel like the room is spinning. SEEK IMMEDIATE MEDICAL CARE IF:  You vomit or  have diarrhea and are unable to eat or drink anything.  You have problems talking, walking, swallowing, or using your arms, hands, or legs.  You feel generally weak.  You are not thinking clearly or you have trouble forming sentences. It may take a friend or family member to notice this.  You have chest pain, abdominal pain, shortness of breath, or sweating.  Your vision changes.  You notice any bleeding.  You have a headache.  You have neck pain or a stiff neck.  You have a fever.   This information is not intended to replace advice given to you by your health care provider. Make sure you discuss any questions you have with your health care provider.   Document Released: 03/08/2001 Document Revised: 01/27/2015 Document Reviewed: 09/08/2014 Elsevier Interactive Patient Education [employer].

## 2016-03-01 LAB — TSH: TSH: 2.16 m[IU]/L

## 2016-03-02 NOTE — Telephone Encounter (Signed)
Review for refill. 

## 2016-03-03 ENCOUNTER — Other Ambulatory Visit: Payer: Self-pay | Admitting: Obstetrics

## 2016-03-03 DIAGNOSIS — B9689 Other specified bacterial agents as the cause of diseases classified elsewhere: Secondary | ICD-10-CM

## 2016-03-03 DIAGNOSIS — N760 Acute vaginitis: Principal | ICD-10-CM

## 2016-03-03 MED ORDER — TINIDAZOLE 500 MG PO TABS: ORAL_TABLET | ORAL | Status: DC

## 2016-03-03 NOTE — Telephone Encounter (Signed)
Tindamax Rx

## 2016-03-03 NOTE — Telephone Encounter (Signed)
OK for refills.

## 2016-03-04 ENCOUNTER — Ambulatory Visit (INDEPENDENT_AMBULATORY_CARE_PROVIDER_SITE_OTHER): Payer: BLUE CROSS/BLUE SHIELD | Admitting: Family Medicine

## 2016-03-04 VITALS — BP 116/80 | HR 76 | Temp 98.4°F | Resp 16 | Ht 60.25 in | Wt 164.2 lb

## 2016-03-04 DIAGNOSIS — R5383 Other fatigue: Secondary | ICD-10-CM | POA: Diagnosis not present

## 2016-03-04 DIAGNOSIS — L298 Other pruritus: Secondary | ICD-10-CM

## 2016-03-04 DIAGNOSIS — N760 Acute vaginitis: Secondary | ICD-10-CM

## 2016-03-04 DIAGNOSIS — A499 Bacterial infection, unspecified: Secondary | ICD-10-CM

## 2016-03-04 DIAGNOSIS — B9689 Other specified bacterial agents as the cause of diseases classified elsewhere: Secondary | ICD-10-CM

## 2016-03-04 DIAGNOSIS — Z113 Encounter for screening for infections with a predominantly sexual mode of transmission: Secondary | ICD-10-CM

## 2016-03-04 DIAGNOSIS — N898 Other specified noninflammatory disorders of vagina: Secondary | ICD-10-CM | POA: Diagnosis not present

## 2016-03-04 LAB — POCT WET + KOH PREP
TRICH BY WET PREP: ABSENT
YEAST BY KOH: ABSENT
YEAST BY WET PREP: ABSENT

## 2016-03-04 MED ORDER — METRONIDAZOLE 500 MG PO TABS: ORAL_TABLET | ORAL | Status: DC

## 2016-03-04 NOTE — Patient Instructions (Addendum)
IF you received an x-ray today, you will receive an invoice from [facility]. Please contact [facility] at [phone] with questions or concerns regarding your invoice.   IF you received labwork today, you will receive an invoice from [employer]. Please contact [facility] at [phone] with questions or concerns regarding your invoice.   Our billing staff will not be able to assist you with questions regarding bills from these companies.  You will be contacted with the lab results as soon as they are available. The fastest way to get your results is to activate your My Chart account. Instructions are located on the last page of this paperwork. If you have not heard from [place] regarding the results in 2 weeks, please contact this office.     You have some possible findings of bacterial vaginosis or bacterial infection that is causing discharge. However there are only a few of the cells. You can try to metronidazole twice per day for 1 week, then if not improving, return for recheck. If any new rash or worsening symptoms sooner, return for recheck.  Continue to work on sleep, make sure you're eating regular meals and drink plenty of fluids, and if the fatigue is not continuing to improve, can also return to discuss this further.  Return to the clinic or go to the nearest emergency room if any of your symptoms worsen or new symptoms occur.   Bacterial Vaginosis Bacterial vaginosis is a vaginal infection that occurs when the normal balance of bacteria in the vagina is disrupted. It results from an overgrowth of certain bacteria. This is the most common vaginal infection in women of childbearing age. Treatment is important to prevent complications, especially in pregnant women, as it can cause a premature delivery. CAUSES  Bacterial vaginosis is caused by an increase in harmful bacteria that are normally present in smaller amounts in the vagina.  Several different kinds of bacteria can cause bacterial vaginosis. However, the reason that the condition develops is not fully understood. RISK FACTORS Certain activities or behaviors can put you at an increased risk of developing bacterial vaginosis, including:  Having a new sex partner or multiple sex partners.  Douching.  Using an intrauterine device (IUD) for contraception. Women do not get bacterial vaginosis from toilet seats, bedding, swimming pools, or contact with objects around them. SIGNS AND SYMPTOMS  Some women with bacterial vaginosis have no signs or symptoms. Common symptoms include:  Grey vaginal discharge.  A fishlike odor with discharge, especially after sexual intercourse.  Itching or burning of the vagina and vulva.  Burning or pain with urination. DIAGNOSIS  Your health care provider will take a medical history and examine the vagina for signs of bacterial vaginosis. A sample of vaginal fluid may be taken. Your health care provider will look at this sample under a microscope to check for bacteria and abnormal cells. A vaginal pH test may also be done.  TREATMENT  Bacterial vaginosis may be treated with antibiotic medicines. These may be given in the form of a pill or a vaginal cream. A second round of antibiotics may be prescribed if the condition comes back after treatment. Because bacterial vaginosis increases your risk for sexually transmitted diseases, getting treated can help reduce your risk for chlamydia, gonorrhea, HIV, and herpes. HOME CARE INSTRUCTIONS   Only take over-the-counter or prescription medicines as directed by your health care provider.  If antibiotic medicine was prescribed, take it as directed. Make sure you  finish it even if you start to feel better.  Tell all sexual partners that you have a vaginal infection. They should see their health care provider and be treated if they have problems, such as a mild rash or itching.  During  treatment, it is important that you follow these instructions:  Avoid sexual activity or use condoms correctly.  Do not douche.  Avoid alcohol as directed by your health care provider.  Avoid breastfeeding as directed by your health care provider. SEEK MEDICAL CARE IF:   Your symptoms are not improving after 3 days of treatment.  You have increased discharge or pain.  You have a fever. MAKE SURE YOU:   Understand these instructions.  Will watch your condition.  Will get help right away if you are not doing well or get worse. FOR MORE INFORMATION  Centers for Disease Control and Prevention, Division of STD Prevention: [employer] American Sexual Health Association (ASHA): www.ashastd.org    This information is not intended to replace advice given to you by your health care provider. Make sure you discuss any questions you have with your health care provider.   Document Released: 09/12/2005 Document Revised: 10/03/2014 Document Reviewed: 04/24/2013 Elsevier Interactive Patient Education 2016 [place] do have some signs of bacterial vaginosis.  Can try flagyl for this, but if irritation persists - return for recheck.

## 2016-03-04 NOTE — Progress Notes (Addendum)
Subjective:  By signing my name below, I, Moises Blood, attest that this documentation has been prepared under the direction and in the presence of Merri Ray, MD. Electronically Signed: Moises Blood, [place]. 03/04/2016 , 5:06 PM .  Patient was seen in Room 9 .   Patient ID: Amy Santana, female    DOB: [birth year], 45 y.o.   MRN: [id number] Chief Complaint  Patient presents with  . vaginal itching    x 1 week   HPI Amy Santana is a 45 y.o. female  Patient was seen 4 days ago with dizziness and fatigue. She had a reassuring exam with EKG and CBC in office. She also had normal CMP and normal TSH. She was advised increased fluids and rest. Also had recommended snacks throughout the day with sufficient meals. Follow up if not improving.   Patient states she's feeling better from last visit. She increased her sleep by going to bed an hour earlier. She's overall feeling better.   Patient noticed having vaginal itching that started a week ago. She has leftover suppositories and cream that were prescribed last year. She's been the suppositories at night and cream on the outside twice a day. She's been using both of these for a week now. She denies any improvement.   She denies recent antibiotic use or new sexual partners. She does want to have STI testing performed. She denies history history of STI in the past. She's currently at the end of her menses, which started a week ago. Her menses usually last about 5 days.   Patient Active Problem List   Diagnosis Date Noted  . Seasonal allergic rhinitis 01/17/2013  . GERD without esophagitis 01/17/2013  . Allergic rhinitis, seasonal 01/17/2013  . Nasal polyp 01/04/2013   Past Medical History  Diagnosis Date  . No pertinent past medical history   . Uterine fibroid    Past Surgical History  Procedure Laterality Date  . Nasal polyp excision    . Wisdom tooth extraction     No Known Allergies Prior to Admission  medications   Medication Sig Start Date End Date Taking? Authorizing Provider  fluticasone (FLONASE) 50 MCG/ACT nasal spray Place 2 sprays into both nostrils daily. 11/20/15   Chelle Jeffery, PA-C  tinidazole (TINDAMAX) 500 MG tablet TAKE 2 TABLETS (1,000 MG TOTAL) BY MOUTH DAILY WITH BREAKFAST. 03/03/16   Shelly Bombard, MD   Social History   Social History  . Marital Status: Legally Separated    Spouse Name: Elisabeth Cara Oppong-Agyare  . Number of Children: 2  . Years of Education: N/A   Occupational History  . Quality control specialist    Social History Main Topics  . Smoking status: Never Smoker   . Smokeless tobacco: Never Used  . Alcohol Use: No  . Drug Use: No  . Sexual Activity:    Partners: Male    Birth Control/ Protection: Condom, IUD   Other Topics Concern  . Not on file   Social History Narrative   From [place].   Came to the [place] at age 42.   Lives with her mother and her 3 children.   Separated from her husband.   Review of Systems  Constitutional: Negative for fever, chills and fatigue.  Respiratory: Negative for cough, shortness of breath and wheezing.   Gastrointestinal: Negative for nausea and vomiting.  Genitourinary: Negative for vaginal discharge, genital sores and pelvic pain.  Skin: Positive for rash.  Neurological: Negative for dizziness and light-headedness.  Objective:   Physical Exam  Constitutional: She is oriented to person, place, and time. She appears well-developed and well-nourished. No distress.  HENT:  Head: Normocephalic and atraumatic.  Eyes: EOM are normal. Pupils are equal, round, and reactive to light.  Neck: Neck supple.  Cardiovascular: Normal rate.   Pulmonary/Chest: Effort normal. No respiratory distress.  Abdominal: Soft. There is no tenderness.  Genitourinary:  Dried cream on the right but no lesions, skin intact; some blood tinged mucus with small amount of blood at cervical os; no cmt, no adnexal tenderness    Musculoskeletal: Normal range of motion.  Neurological: She is alert and oriented to person, place, and time.  Skin: Skin is warm and dry.  Psychiatric: She has a normal mood and affect. Her behavior is normal.  Nursing note and vitals reviewed.   Filed Vitals:   03/04/16 1610  BP: 116/80  Pulse: 76  Temp: 98.4 F (36.9 C)  TempSrc: Oral  Resp: 16  Height: 5' 0.25" (1.53 m)  Weight: 164 lb 3.2 oz (74.481 kg)  SpO2: 97%   Results for orders placed or performed in visit on 03/04/16  POCT Wet + KOH Prep  Result Value Ref Range   Yeast by KOH Absent Present, Absent   Yeast by wet prep Absent Present, Absent   WBC by wet prep Moderate (A) None, Few, Too numerous to count   Clue Cells Wet Prep HPF POC Few (A) None, Too numerous to count   Trich by wet prep Absent Present, Absent   Bacteria Wet Prep HPF POC Few None, Few, Too numerous to count   Epithelial Cells By [employer] ([facility]) None None, Few, Too numerous to count   RBC,UR,HPF,POC None None RBC/hpf       Assessment & Plan:   Amy Santana is a 45 y.o. female Vagina itching - Plan: POCT Wet + KOH Prep  Vaginal discharge - Plan: POCT Wet + KOH Prep, metroNIDAZOLE (FLAGYL) 500 MG tablet  Other fatigue  Routine screening for STI (sexually transmitted infection) - Plan: GC/Chlamydia Probe Amp, RPR, POCT Wet + KOH Prep, HIV antibody  BV (bacterial vaginosis) - Plan: metroNIDAZOLE (FLAGYL) 500 MG tablet  Fatigue improving with increased sleep. Recommended continuing to work on diet, adequate fluids, and sleep as this appears to be main contributor. RTC precautions if worsening.  Currently at end of menses, no significant rash seen externally on genitalia. Few clue cells, possible mild BV. We'll treat with Flagyl 500 mg twice a day for 1 week, side effects discussed, RTC precautions.  Meds ordered this encounter  Medications  . metroNIDAZOLE (FLAGYL) 500 MG tablet    Sig: 1 pill by mouth twice per day.  Avoid any  alcohol while taking this medicine.    Dispense:  14 tablet    Refill:  0   Patient Instructions       IF you received an x-ray today, you will receive an invoice from [facility]. Please contact [facility] at [phone] with questions or concerns regarding your invoice.   IF you received labwork today, you will receive an invoice from [employer]. Please contact [facility] at [phone] with questions or concerns regarding your invoice.   Our billing staff will not be able to assist you with questions regarding bills from these companies.  You will be contacted with the lab results as soon as they are available. The fastest way to get your results is to activate your My Chart account. Instructions are  located on the last page of this paperwork. If you have not heard from [place] regarding the results in 2 weeks, please contact this office.     You have some possible findings of bacterial vaginosis or bacterial infection that is causing discharge. However there are only a few of the cells. You can try to metronidazole twice per day for 1 week, then if not improving, return for recheck. If any new rash or worsening symptoms sooner, return for recheck.  Continue to work on sleep, make sure you're eating regular meals and drink plenty of fluids, and if the fatigue is not continuing to improve, can also return to discuss this further.  Return to the clinic or go to the nearest emergency room if any of your symptoms worsen or new symptoms occur.   Bacterial Vaginosis Bacterial vaginosis is a vaginal infection that occurs when the normal balance of bacteria in the vagina is disrupted. It results from an overgrowth of certain bacteria. This is the most common vaginal infection in women of childbearing age. Treatment is important to prevent complications, especially in pregnant women, as it can cause a premature delivery. CAUSES  Bacterial vaginosis is  caused by an increase in harmful bacteria that are normally present in smaller amounts in the vagina. Several different kinds of bacteria can cause bacterial vaginosis. However, the reason that the condition develops is not fully understood. RISK FACTORS Certain activities or behaviors can put you at an increased risk of developing bacterial vaginosis, including:  Having a new sex partner or multiple sex partners.  Douching.  Using an intrauterine device (IUD) for contraception. Women do not get bacterial vaginosis from toilet seats, bedding, swimming pools, or contact with objects around them. SIGNS AND SYMPTOMS  Some women with bacterial vaginosis have no signs or symptoms. Common symptoms include:  Grey vaginal discharge.  A fishlike odor with discharge, especially after sexual intercourse.  Itching or burning of the vagina and vulva.  Burning or pain with urination. DIAGNOSIS  Your health care provider will take a medical history and examine the vagina for signs of bacterial vaginosis. A sample of vaginal fluid may be taken. Your health care provider will look at this sample under a microscope to check for bacteria and abnormal cells. A vaginal pH test may also be done.  TREATMENT  Bacterial vaginosis may be treated with antibiotic medicines. These may be given in the form of a pill or a vaginal cream. A second round of antibiotics may be prescribed if the condition comes back after treatment. Because bacterial vaginosis increases your risk for sexually transmitted diseases, getting treated can help reduce your risk for chlamydia, gonorrhea, HIV, and herpes. HOME CARE INSTRUCTIONS   Only take over-the-counter or prescription medicines as directed by your health care provider.  If antibiotic medicine was prescribed, take it as directed. Make sure you finish it even if you start to feel better.  Tell all sexual partners that you have a vaginal infection. They should see their health  care provider and be treated if they have problems, such as a mild rash or itching.  During treatment, it is important that you follow these instructions:  Avoid sexual activity or use condoms correctly.  Do not douche.  Avoid alcohol as directed by your health care provider.  Avoid breastfeeding as directed by your health care provider. SEEK MEDICAL CARE IF:   Your symptoms are not improving after 3 days of treatment.  You have increased discharge or pain.  You have a fever. MAKE SURE YOU:   Understand these instructions.  Will watch your condition.  Will get help right away if you are not doing well or get worse. FOR MORE INFORMATION  Centers for Disease Control and Prevention, Division of STD Prevention: [employer]raud.[employer] American Sexual Health Association (ASHA): www.ashastd.org    This information is not intended to replace advice given to you by your health care provider. Make sure you discuss any questions you have with your health care provider.   Document Released: 09/12/2005 Document Revised: 10/03/2014 Document Reviewed: 04/24/2013 Elsevier Interactive Patient Education 2016 [place] do have some signs of bacterial vaginosis.  Can try flagyl for this, but if irritation persists - return for recheck.        I personally performed the services described in this documentation, which was scribed in my presence. The recorded information has been reviewed and considered, and addended by me as needed.   Signed,   Merri Ray, MD [facility].  03/05/2016 3:48 PM

## 2016-03-05 ENCOUNTER — Telehealth: Payer: Self-pay

## 2016-03-05 LAB — GC/CHLAMYDIA PROBE AMP
CT PROBE, AMP APTIMA: NOT DETECTED
GC PROBE AMP APTIMA: NOT DETECTED

## 2016-03-05 LAB — HIV ANTIBODY (ROUTINE TESTING W REFLEX): HIV: NONREACTIVE

## 2016-03-05 NOTE — Telephone Encounter (Signed)
Pt is needing to talk with someone about her medication she was given yesterday she states it is not working    [employer] number [phone]

## 2016-03-07 NOTE — Telephone Encounter (Signed)
She does need to return to clinic or other medical care provider as swelling could be a cellulitis or skin infection, or possible allergic reaction. If she cannot see [place] tonight, recommend she be seen either at an emergency room or other care provider.

## 2016-03-07 NOTE — Telephone Encounter (Signed)
Dr. Carlota Raspberry,  Patient called back stating that she is having now severe itching and her private area is now swollen. Advise patient to RTC but giving her work schedule she is not able to come back in. Patient would like to know what can she take to help with the itching. Please advise   Thanks   Angeleena Dueitt

## 2016-03-07 NOTE — Telephone Encounter (Signed)
Pt made aware of Dr. Vonna Kotyk recommendations, per pt she does not feel its an allergic reaction to medication

## 2016-03-08 ENCOUNTER — Ambulatory Visit (INDEPENDENT_AMBULATORY_CARE_PROVIDER_SITE_OTHER): Payer: BLUE CROSS/BLUE SHIELD | Admitting: Obstetrics

## 2016-03-08 ENCOUNTER — Encounter: Payer: Self-pay | Admitting: Obstetrics

## 2016-03-08 VITALS — BP 114/79 | HR 80 | Wt 167.0 lb

## 2016-03-08 DIAGNOSIS — B373 Candidiasis of vulva and vagina: Secondary | ICD-10-CM | POA: Diagnosis not present

## 2016-03-08 DIAGNOSIS — L298 Other pruritus: Secondary | ICD-10-CM

## 2016-03-08 DIAGNOSIS — B3731 Acute candidiasis of vulva and vagina: Secondary | ICD-10-CM

## 2016-03-08 DIAGNOSIS — N898 Other specified noninflammatory disorders of vagina: Secondary | ICD-10-CM

## 2016-03-08 LAB — POCT URINALYSIS DIPSTICK
BILIRUBIN UA: NEGATIVE
Glucose, UA: NEGATIVE
Ketones, UA: NEGATIVE
NITRITE UA: NEGATIVE
PH UA: 5
Protein, UA: NEGATIVE
Spec Grav, UA: 1.02
UROBILINOGEN UA: NEGATIVE

## 2016-03-08 LAB — RPR

## 2016-03-08 MED ORDER — FLUCONAZOLE 200 MG PO TABS: ORAL_TABLET | ORAL | Status: DC

## 2016-03-08 MED ORDER — TERCONAZOLE 0.4 % VA CREA: 1.0000 | TOPICAL_CREAM | Freq: Every day | VAGINAL | Status: DC

## 2016-03-08 MED ORDER — CLOTRIMAZOLE 1 % EX CREA: 1.0000 "application " | TOPICAL_CREAM | Freq: Two times a day (BID) | CUTANEOUS | Status: DC

## 2016-03-09 ENCOUNTER — Encounter: Payer: Self-pay | Admitting: Obstetrics

## 2016-03-09 NOTE — Progress Notes (Signed)
Patient ID: Amy Santana, female   DOB: 11/28/1970, 45 y.o.   MRN: [id number]  Chief Complaint  Patient presents with  . Vaginal Problem    itching  odor discharge    HPI Amy Santana is a 45 y.o. female.  Vaginal itching and discharge.  HPI  Past Medical History  Diagnosis Date  . No pertinent past medical history   . Uterine fibroid     Past Surgical History  Procedure Laterality Date  . Nasal polyp excision    . Wisdom tooth extraction      Family History  Problem Relation Age of Onset  . Hypertension Mother     Social History Social History  Substance Use Topics  . Smoking status: Never Smoker   . Smokeless tobacco: Never Used  . Alcohol Use: No    No Known Allergies  Current Outpatient Prescriptions  Medication Sig Dispense Refill  . fluticasone (FLONASE) 50 MCG/ACT nasal spray Place 2 sprays into both nostrils daily. 16 g 12  . metroNIDAZOLE (FLAGYL) 500 MG tablet 1 pill by mouth twice per day.  Avoid any alcohol while taking this medicine. 14 tablet 0  . clotrimazole (LOTRIMIN) 1 % cream Apply 1 application topically 2 (two) times daily. 60 g 2  . fluconazole (DIFLUCAN) 200 MG tablet Take 1 tablet po every other day 3 tablet 2  . terconazole (TERAZOL 7) 0.4 % vaginal cream Place 1 applicator vaginally at bedtime. 45 g 2   No current facility-administered medications for this visit.    Review of Systems Review of Systems Constitutional: negative for fatigue and weight loss Respiratory: negative for cough and wheezing Cardiovascular: negative for chest pain, fatigue and palpitations Gastrointestinal: negative for abdominal pain and change in bowel habits Genitourinary:negative Integument/breast: negative for nipple discharge Musculoskeletal:negative for myalgias Neurological: negative for gait problems and tremors Behavioral/Psych: negative for abusive relationship, depression Endocrine: negative for temperature intolerance     Blood  pressure 114/79, pulse 80, weight 167 lb (75.751 kg), last menstrual period 02/26/2016, currently breastfeeding.  Physical Exam Physical Exam            General:  Alert and no distress Abdomen:  normal findings: no organomegaly, soft, non-tender and no hernia  Pelvis:  External genitalia: normal general appearance Urinary system: urethral meatus normal and bladder without fullness, nontender Vaginal: normal without tenderness, induration or masses Cervix: normal appearance Adnexa: normal bimanual exam Uterus: anteverted and non-tender, normal size      Data Reviewed Wet prep  Assessment     Candida vulvovaginitis     Plan    Diflucan Rx Terazol 7 Rx Clotrimazole external cream Rx F/U prn  Orders Placed This Encounter  Procedures  . POCT urinalysis dipstick   Meds ordered this encounter  Medications  . terconazole (TERAZOL 7) 0.4 % vaginal cream    Sig: Place 1 applicator vaginally at bedtime.    Dispense:  45 g    Refill:  2  . clotrimazole (LOTRIMIN) 1 % cream    Sig: Apply 1 application topically 2 (two) times daily.    Dispense:  60 g    Refill:  2  . fluconazole (DIFLUCAN) 200 MG tablet    Sig: Take 1 tablet po every other day    Dispense:  3 tablet    Refill:  2

## 2016-03-13 LAB — NUSWAB VG, CANDIDA 6SP
ATOPOBIUM VAGINAE: HIGH {score} — AB
CANDIDA KRUSEI, NAA: NEGATIVE
CANDIDA LUSITANIAE, NAA: NEGATIVE
CANDIDA PARAPSILOSIS, NAA: NEGATIVE
CANDIDA TROPICALIS, NAA: NEGATIVE
Candida albicans, NAA: POSITIVE — AB
Candida glabrata, NAA: NEGATIVE
Trich vag by NAA: NEGATIVE

## 2016-03-17 ENCOUNTER — Other Ambulatory Visit: Payer: Self-pay | Admitting: Obstetrics

## 2016-03-19 ENCOUNTER — Other Ambulatory Visit: Payer: Self-pay | Admitting: Obstetrics

## 2016-08-26 ENCOUNTER — Ambulatory Visit (INDEPENDENT_AMBULATORY_CARE_PROVIDER_SITE_OTHER): Payer: BLUE CROSS/BLUE SHIELD | Admitting: Emergency Medicine

## 2016-08-26 VITALS — BP 138/80 | HR 79 | Temp 98.3°F | Resp 16 | Wt 156.4 lb

## 2016-08-26 DIAGNOSIS — J339 Nasal polyp, unspecified: Secondary | ICD-10-CM

## 2016-08-26 DIAGNOSIS — J029 Acute pharyngitis, unspecified: Secondary | ICD-10-CM

## 2016-08-26 LAB — POCT RAPID STREP A (OFFICE): RAPID STREP A SCREEN: NEGATIVE

## 2016-08-26 MED ORDER — FLUTICASONE PROPIONATE 50 MCG/ACT NA SUSP: 2.0000 | Freq: Every day | NASAL | 12 refills | Status: DC

## 2016-08-26 MED ORDER — PREDNISONE 10 MG PO TABS: ORAL_TABLET | ORAL | 0 refills | Status: DC

## 2016-08-26 MED ORDER — AMOXICILLIN 875 MG PO TABS: 875.0000 mg | ORAL_TABLET | Freq: Two times a day (BID) | ORAL | 0 refills | Status: DC

## 2016-08-26 NOTE — Progress Notes (Addendum)
Patient ID: Amy Santana, female   DOB: [birth year], 45 y.o.   MRN: [id number]    By signing my name below, I, Essence Howell, attest that this documentation has been prepared under the direction and in the presence of Darlyne Russian, MD Electronically Signed: Ladene Artist, ED Scribe 08/26/2016 at 6:44 PM.  Chief Complaint:  Chief Complaint  Patient presents with  . Nasal Congestion    history of polyp   HPI: Amy Santana is a 45 y.o. female who reports to [facility] today complaining of gradually worsening nasal congestion for several days. Pt reports a pshx which includes nasal polyp excision approximately 18 years ago while in [place] but states that they occasionally flare up. She reports associated symptoms of sore throat, thickened discolored rhinorrhea and dry cough that has improved with honey. She has also tried Flonase but recently ran out and Claritin yesterday. Pt states that her polyps usually improve with Prednisone. Both of her children have been ill with similar symptoms.   Past Medical History:  Diagnosis Date  . No pertinent past medical history   . Uterine fibroid    Past Surgical History:  Procedure Laterality Date  . NASAL POLYP EXCISION    . WISDOM TOOTH EXTRACTION     Social History   Social History  . Marital status: Legally Separated    Spouse name: Elisabeth Cara Oppong-Agyare  . Number of children: 2  . Years of education: N/A   Occupational History  . Quality control specialist Unemployed   Social History Main Topics  . Smoking status: Never Smoker  . Smokeless tobacco: Never Used  . Alcohol use No  . Drug use: No  . Sexual activity: Yes    Partners: Male    Birth control/ protection: Condom, IUD   Other Topics Concern  . Not on file   Social History Narrative   From [place].   Came to the [place] at age 29.   Lives with her mother and her 3 children.   Separated from her husband.   Family History  Problem Relation Age of Onset  .  Hypertension Mother    No Known Allergies Prior to Admission medications   Medication Sig Start Date End Date Taking? Authorizing Provider  clotrimazole (LOTRIMIN) 1 % cream Apply 1 application topically 2 (two) times daily. Patient not taking: Reported on 08/26/2016 03/08/16   Shelly Bombard, MD  fluconazole (DIFLUCAN) 200 MG tablet Take 1 tablet po every other day Patient not taking: Reported on 08/26/2016 03/08/16   Shelly Bombard, MD  fluticasone [facility]) 50 MCG/ACT nasal spray Place 2 sprays into both nostrils daily. Patient not taking: Reported on 08/26/2016 11/20/15   Harrison Mons, PA-C  metroNIDAZOLE (FLAGYL) 500 MG tablet 1 pill by mouth twice per day.  Avoid any alcohol while taking this medicine. Patient not taking: Reported on 08/26/2016 03/04/16   Wendie Agreste, MD  terconazole (TERAZOL 7) 0.4 % vaginal cream Place 1 applicator vaginally at bedtime. Patient not taking: Reported on 08/26/2016 03/08/16   Shelly Bombard, MD    ROS: The patient denies fevers, chills, night sweats, unintentional weight loss, chest pain, palpitations, wheezing, dyspnea on exertion, nausea, vomiting, abdominal pain, dysuria, hematuria, melena, numbness, weakness, or tingling. +congestion, +cough, +sore throat, +rhinorrhea  All other systems have been reviewed and were otherwise negative with the exception of those mentioned in the HPI and as above.    PHYSICAL EXAM: Vitals:   08/26/16 1811  BP: 138/80  Pulse: 79  Resp: 16  Temp: 98.3 F (36.8 C)   Body mass index is 30.29 kg/m.  General: Alert, no acute distress HEENT:  Normocephalic, atraumatic, oropharynx patent. TMs are normal. Bilateral large obstructive nasal polyps. Throat is red.  Eye: Juliette Mangle [facility] Cardiovascular:  Regular rate and rhythm, no rubs murmurs or gallops. No Carotid bruits, radial pulse intact. No pedal edema.  Respiratory: Clear to auscultation bilaterally.  No wheezes, rales, or rhonchi. No cyanosis, no use of  accessory musculature Abdominal: No organomegaly, abdomen is soft and non-tender, positive bowel sounds. No masses. Musculoskeletal: Gait intact. No edema, tenderness Skin: No rashes. Neurologic: Facial musculature symmetric. Psychiatric: Patient acts appropriately throughout our interaction. Lymphatic: No cervical or submandibular lymphadenopathy Meds ordered this encounter  Medications  . predniSONE (DELTASONE) 10 MG tablet    Sig: Take 4 a day for 3 days 3 a day for 3 days 2 a day for 3 days one a day for 3 days    Dispense:  30 tablet    Refill:  0  . fluticasone (FLONASE) 50 MCG/ACT nasal spray    Sig: Place 2 sprays into both nostrils daily.    Dispense:  16 g    Refill:  12  . amoxicillin (AMOXIL) 875 MG tablet    Sig: Take 1 tablet (875 mg total) by mouth 2 (two) times daily.    Dispense:  14 tablet    Refill:  0   LABS: Results for orders placed or performed in visit on 08/26/16  POCT rapid strep A  Result Value Ref Range   Rapid Strep A Screen Negative Negative  EKG/XRAY:   Primary read interpreted by Dr. Everlene Farrier at [facility].  ASSESSMENT/PLAN:  We'll treat with prednisone taper and she was given amoxicillin 4. Nasal drainage. She will also have Flonase. She declined referral to ENT. She will continue Claritin daily.I personally performed the services described in this documentation, which was scribed in my presence. The recorded information has been reviewed and is accurate.Patient states she is not pregnant at present.   Gross sideeffects, risk and benefits, and alternatives of medications d/w patient. Patient is aware that all medications have potential sideeffects and we are unable to predict every sideeffect or drug-drug interaction that may occur.  Arlyss Queen MD 08/26/2016 6:44 PM

## 2016-08-26 NOTE — Patient Instructions (Addendum)
IF you received an x-ray today, you will receive an invoice from [facility]. Please contact [facility] at [phone] with questions or concerns regarding your invoice.   IF you received labwork today, you will receive an invoice from [employer]. Please contact [facility] at [phone] with questions or concerns regarding your invoice.   Our billing staff will not be able to assist you with questions regarding bills from these companies.  You will be contacted with the lab results as soon as they are available. The fastest way to get your results is to activate your My Chart account. Instructions are located on the last page of this paperwork. If you have not heard from [place] regarding the results in 2 weeks, please contact this office.    Nasal [polyps Nasal Polyps Nasal polyps are growths that form in the nose. Irritation and swelling (inflammation) in the nose or sinus openings can lead to changes in the tissue (mucosa) that lines these areas. Long-term inflammation causes the mucosa to balloon out or grow into a polyp. The polyp fills with watery mucus. Nasal polyps look like moist, gray grapes in the nose.Nasal polyps are not cancer. They do not increase your risk of cancer. You may have one nasal polyp or more than one. They can be small or large. In most cases, they form in both sides of the nose. Polyps can make it hard to breathe through your nose (nasal obstruction). What are the causes? The exact cause of nasal polyps is not known. What increases the risk? You are more likely to develop nasal polyps if you:  Have a family history of the condition.  Have a disease that causes inflammation in your nose or sinuses.  Have another condition that affects your nose or sinuses, such as:  Nasal allergies (allergic rhinitis).  Long-term nasal obstruction (nonallergic rhinitis).  Asthma.  Nasal or sinus infection, especially fungal  infection.  Are female.  Are older than 44 years of age.  Have a sensitivity to aspirin or alcohol.  Smoke.  Have a disease passed down through families that causes increased production of thick mucus (cystic fibrosis). What are the signs or symptoms? Symptoms depend on the size of the polyps. Small polyps may cause few symptoms. When symptoms develop, they may include:  Nasal obstruction.  Decreased senses of smell and taste.  Runny nose.  The feeling of mucus going down the back of the throat (postnasal drip).  Headache, face pain, or sinus pressure.  Snoring.  Frequent nasal or sinus infections.  Itchy eyes. How is this diagnosed?   Nasal polyps may be diagnosed based on your symptoms, your medical history, and a physical exam of the inside of your nose. You may also have tests, such as:  Imaging studies such as a CT scan or MRI to see how large your polyps are and if any are in your sinuses.  Skin or blood tests to find out if your polyps may be caused by allergies.  Washings or swabs taken from your nose to test for inflammation or infection. How is this treated? Small nasal polyps that are not causing symptoms may not need treatment. For large polyps that are causing symptoms, the goal of treatment is to reduce nasal obstruction and improve sinus drainage. Treatment may include:  A medicine to reduce inflammation (steroid). This is usually the first treatment. You may have to take steroids for a short or long period of time. Short-term steroids are  usually taken as pills. Long-term steroid treatment is usually in the form of nose drops or spray.  Medicines to treat an underlying condition, such as allergies, asthma, or infection.  Surgery. This may be needed to remove nasal polyps if medicine does not help. Follow these instructions at home:  Take or use over-the-counter and prescription medicines only as told by your health care provider. Do not stop using your  medicine even if you start to feel better.  Use solutions to wash or rinse out the inside of your nose (nasal washes or irrigations) as told by your health care provider.  Do not take medicines that contain aspirin if they make your symptoms worse.  Do not drink alcohol if it makes your symptoms worse.  Do not use any tobacco products, such as cigarettes, chewing tobacco, and e-cigarettes. If you need help quitting, ask your health care provider.  Keep all follow-up visits as told by your health care provider. This is important. Contact a health care provider if:  Your condition does not get better or it gets worse at home after treatment.  You have a fever.  You have headaches or pain in your face that is new or is getting worse.  You have a bloody nose. This information is not intended to replace advice given to you by your health care provider. Make sure you discuss any questions you have with your health care provider. Document Released: 01/04/2016 Document Revised: 02/18/2016 Document Reviewed: 12/03/2015 Elsevier Interactive Patient Education  2017 [employer].

## 2016-08-28 LAB — CULTURE, GROUP A STREP: ORGANISM ID, BACTERIA: NORMAL

## 2017-09-06 ENCOUNTER — Encounter: Payer: Self-pay | Admitting: Physician Assistant

## 2017-09-06 ENCOUNTER — Other Ambulatory Visit: Payer: Self-pay

## 2017-09-06 ENCOUNTER — Ambulatory Visit: Payer: BLUE CROSS/BLUE SHIELD | Admitting: Physician Assistant

## 2017-09-06 VITALS — BP 110/88 | HR 83 | Temp 98.4°F | Resp 18 | Ht 60.25 in | Wt 173.2 lb

## 2017-09-06 DIAGNOSIS — J339 Nasal polyp, unspecified: Secondary | ICD-10-CM

## 2017-09-06 DIAGNOSIS — J0100 Acute maxillary sinusitis, unspecified: Secondary | ICD-10-CM | POA: Diagnosis not present

## 2017-09-06 MED ORDER — FLUTICASONE PROPIONATE 50 MCG/ACT NA SUSP
2.0000 | Freq: Every day | NASAL | 12 refills | Status: DC
Start: 2017-09-06 — End: 2018-05-05

## 2017-09-06 MED ORDER — PREDNISONE 20 MG PO TABS
ORAL_TABLET | ORAL | 0 refills | Status: DC
Start: 2017-09-06 — End: 2017-12-12

## 2017-09-06 MED ORDER — AZELASTINE HCL 0.1 % NA SOLN
2.0000 | Freq: Two times a day (BID) | NASAL | 0 refills | Status: DC
Start: 2017-09-06 — End: 2018-05-05

## 2017-09-06 MED ORDER — AMOXICILLIN-POT CLAVULANATE 875-125 MG PO TABS: 1.0000 | ORAL_TABLET | Freq: Two times a day (BID) | ORAL | 0 refills | Status: AC

## 2017-09-06 NOTE — Progress Notes (Signed)
Patient ID: Amy Santana, female    DOB: [birth year], 46 y.o.   MRN: [id number]  PCP: Jaynee Eagles, PA-C  Chief Complaint  Patient presents with  . Sinus Problem    x4 days, pt states she has a stuffy nose and it is making her sneeze and some pressure.    Subjective:   Presents for evaluation of sinus problem.  Runny nose and sneezing x 1 week. Nasal drainage is yellow. Pain in LEFT cheek. Nasal polyp. Surgery in 1999. Has regrown. Has seen ENT again, but doesn't want to have surgery again if she can avoid it. Gets symptoms about once every two years. "I usually get prednisone." Sometimes gets an antibiotic, too.  Intermittent fever, fatigue. Uses acetaminophen PRN.     Review of Systems As above. No dizziness, headache. No GI/GU symptoms. No cough, sore throat, ear pain.     Patient Active Problem List   Diagnosis Date Noted  . Seasonal allergic rhinitis 01/17/2013  . GERD without esophagitis 01/17/2013  . Allergic rhinitis, seasonal 01/17/2013  . Nasal polyp 01/04/2013     Prior to Admission medications   Medication Sig Start Date End Date Taking? Authorizing Provider  NONE          No Known Allergies     Objective:  Physical Exam  Constitutional: She is oriented to person, place, and time. She appears well-developed and well-nourished. No distress.  BP 110/88 (BP Location: Left Arm, Patient Position: Sitting, Cuff Size: Normal)   Pulse 83   Temp 98.4 F (36.9 C) (Oral)   Resp 18   Ht 5' 0.25" (1.53 m)   Wt 173 lb 3.2 oz (78.6 kg)   SpO2 97%   Breastfeeding? No   BMI 33.55 kg/m    HENT:  Head: Normocephalic and atraumatic.  Right Ear: Hearing, tympanic membrane, external ear and ear canal normal.  Left Ear: Hearing, tympanic membrane, external ear and ear canal normal.  Nose: Mucosal edema (L>R) and rhinorrhea present.  No foreign bodies. Right sinus exhibits no maxillary sinus tenderness and no frontal sinus tenderness. Left sinus  exhibits no maxillary sinus tenderness and no frontal sinus tenderness.    Mouth/Throat: Uvula is midline, oropharynx is clear and moist and mucous membranes are normal. No uvula swelling. No oropharyngeal exudate.  Eyes: Conjunctivae and EOM are normal. Pupils are equal, round, and reactive to light. Right eye exhibits no discharge. Left eye exhibits no discharge. No scleral icterus.  Neck: Trachea normal, normal range of motion and full passive range of motion without pain. Neck supple. No thyroid mass and no thyromegaly present.  Cardiovascular: Normal rate, regular rhythm and normal heart sounds.  Pulmonary/Chest: Effort normal and breath sounds normal.  Lymphadenopathy:       Head (right side): No submandibular, no tonsillar, no preauricular, no posterior auricular and no occipital adenopathy present.       Head (left side): No submandibular, no tonsillar, no preauricular and no occipital adenopathy present.    She has no cervical adenopathy.       Right: No supraclavicular adenopathy present.       Left: No supraclavicular adenopathy present.  Neurological: She is alert and oriented to person, place, and time. She has normal strength. No cranial nerve deficit or sensory deficit.  Skin: Skin is warm, dry and intact. No rash noted.  Psychiatric: She has a normal mood and affect. Her speech is normal and behavior is normal.  Assessment & Plan:   Problem List Items Addressed This Visit    Nasal polyp - Primary   Relevant Medications   fluticasone (FLONASE) 50 MCG/ACT nasal spray    Other Visit Diagnoses    Subacute maxillary sinusitis       Relevant Medications   azelastine (ASTELIN) 0.1 % nasal spray   amoxicillin-clavulanate (AUGMENTIN) 875-125 MG tablet   predniSONE (DELTASONE) 20 MG tablet   fluticasone (FLONASE) 50 MCG/ACT nasal spray       No Follow-up on file.   Fara Chute, PA-C [facility] [place]

## 2017-09-06 NOTE — Patient Instructions (Addendum)
Please schedule a visit with Dr. Jodi Mourning or here for your breast exam and pap test. Your last one was in 12/2012.    IF you received an x-ray today, you will receive an invoice from [facility]. Please contact [facility] at [phone] with questions or concerns regarding your invoice.   IF you received labwork today, you will receive an invoice from [facility]. Please contact [facility] at [phone] with questions or concerns regarding your invoice.   Our billing staff will not be able to assist you with questions regarding bills from these companies.  You will be contacted with the lab results as soon as they are available. The fastest way to get your results is to activate your My Chart account. Instructions are located on the last page of this paperwork. If you have not heard from [place] regarding the results in 2 weeks, please contact this office.

## 2017-12-12 ENCOUNTER — Ambulatory Visit (HOSPITAL_COMMUNITY)
Admission: EM | Admit: 2017-12-12 | Discharge: 2017-12-12 | Disposition: A | Discharge status: Home / Self Care | Payer: BLUE CROSS/BLUE SHIELD | Attending: Family Medicine | Admitting: Family Medicine

## 2017-12-12 ENCOUNTER — Encounter (HOSPITAL_COMMUNITY): Payer: Self-pay | Admitting: Family Medicine

## 2017-12-12 ENCOUNTER — Other Ambulatory Visit: Payer: Self-pay

## 2017-12-12 ENCOUNTER — Ambulatory Visit: Payer: Self-pay | Admitting: *Deleted

## 2017-12-12 DIAGNOSIS — R05 Cough: Secondary | ICD-10-CM

## 2017-12-12 DIAGNOSIS — J339 Nasal polyp, unspecified: Secondary | ICD-10-CM

## 2017-12-12 DIAGNOSIS — H10021 Other mucopurulent conjunctivitis, right eye: Secondary | ICD-10-CM | POA: Diagnosis not present

## 2017-12-12 DIAGNOSIS — R059 Cough, unspecified: Secondary | ICD-10-CM

## 2017-12-12 DIAGNOSIS — J0100 Acute maxillary sinusitis, unspecified: Secondary | ICD-10-CM

## 2017-12-12 MED ORDER — PREDNISONE 20 MG PO TABS: ORAL_TABLET | ORAL | 0 refills | Status: DC

## 2017-12-12 MED ORDER — POLYMYXIN B-TRIMETHOPRIM 10000-0.1 UNIT/ML-% OP SOLN: 1.0000 [drp] | OPHTHALMIC | 0 refills | Status: DC

## 2017-12-12 MED ORDER — TRIAMCINOLONE ACETONIDE 55 MCG/ACT NA AERO: 2.0000 | INHALATION_SPRAY | Freq: Every day | NASAL | 12 refills | Status: DC

## 2017-12-12 NOTE — Telephone Encounter (Signed)
Pt states on yesterday while waling out of [employer] she walked into a tree branch. Pt states today her entire right eye is red and she has a yellow discharge coming from the eye. Pt states she feels a sharp pain with closing her eye and is currently rating her pain 6. Pt denies any blurry vision, swelling or other symptoms at this time. Pt states she does not see any visible damage to the eye. Pt advised to seek treatment in the ED. Pt verbalized understanding.   Reason for Disposition . Sharp object hit the eye (e.g., metallic chip or flying glass)  Answer Assessment - Initial Assessment Questions 1. MECHANISM: "How did the injury happen?"      Walked into tree limb 2. ONSET: "When did the injury happen?" (Minutes or hours ago)     yesterday 3. LOCATION: "What part of the eye is injured?" (cornea, sclera, eyelid, or periorbital tissue)     The whole eye is red 4. APPEARANCE: "What does the eye look like?"      The entire eye is red 5. VISION: "Is the vision blurred?"      No 6. PAIN: "Is it painful?" If so, ask: "How bad is the pain?"   (e.g., Scale 1-10; or mild, moderate, severe)     Pain is 6 7. SIZE: For cuts, bruises, or swelling, ask: "How large is it?" (e.g., inches or centimeters)     No able to see any cuts and no swelling 8. TETANUS: For any breaks in the skin, ask: "When was the last tetanus booster?"     Doesn't remember 9. CONTACTS: "Do you wear contacts?"     No 10. OTHER SYMPTOMS: "Do you have any other symptoms?" (e.g., headache, neck pain, vomiting)       No 11. PREGNANCY: "Is there any chance you are pregnant?" "When was your last menstrual period?"       No, last cycle was on the 28th  Protocols used: [place]

## 2017-12-12 NOTE — ED Provider Notes (Signed)
[place]   [id number] 12/12/17 Arrival Time: 1618   SUBJECTIVE:  Amy Santana is a 47 y.o. female who presents to the urgent care with complaint of right eye watering and irritation after branch hit her yesterday.  She went to work today at [employer]  Also, patient has nasal polyps which cause nasal congestion.  Flonase has not helped over the past two years.  Finally, patient has a chronic dry cough for a week.  No fever.  She does have allergies.     Past Medical History:  Diagnosis Date  . No pertinent past medical history   . Uterine fibroid    Family History  Problem Relation Age of Onset  . Hypertension Mother    Social History   Socioeconomic History  . Marital status: Legally Separated    Spouse name: Elisabeth Cara Oppong-Agyare  . Number of children: 3  . Years of education: Not on file  . Highest education level: Bachelor's degree (e.g., BA, AB, BS)  Social Needs  . Financial resource strain: Not on file  . Food insecurity - worry: Not on file  . Food insecurity - inability: Not on file  . Transportation needs - medical: Not on file  . Transportation needs - non-medical: Not on file  Occupational History  . Occupation: Cytogeneticist    Comment: [employer]  Tobacco Use  . Smoking status: Never Smoker  . Smokeless tobacco: Never Used  Substance and Sexual Activity  . Alcohol use: No    Alcohol/week: 0.0 oz  . Drug use: No  . Sexual activity: Yes    Partners: Male    Birth control/protection: Condom, IUD  Other Topics Concern  . Not on file  Social History Narrative   From [place].   Came to the [place] at age 64.   Lives with her mother and her 3 children.   Separated from her husband.   No outpatient medications have been marked as taking for the 12/12/17 encounter [facility] Encounter).   No Known Allergies    ROS: As per HPI, remainder of ROS negative.   OBJECTIVE:   Vitals:   12/12/17 1649  BP: 119/76  Pulse: 63  Resp:  18  Temp: 98.3 F (36.8 C)  TempSrc: Oral  SpO2: 96%     General appearance: alert; no distress Eyes: PERRL; EOMI; conjunctiva red with purulent d/c on right; flourescein shows small corneal abrasion superiorly HENT: normocephalic; atraumatic; TMs normal, canal normal, external ears normal without trauma; nasal mucosa shows bilateral nasal polyps and swelling; oral mucosa normal Neck: supple Lungs: expiratory wheezes Heart: regular rate and rhythm Back: no CVA tenderness Extremities: no cyanosis or edema; symmetrical with no gross deformities Skin: warm and dry Neurologic: normal gait; grossly normal Psychological: alert and cooperative; normal mood and affect      Labs:  Results for orders placed or performed in visit on 08/26/16  Culture, Group A Strep  Result Value Ref Range   Organism ID, Bacteria      Normal Upper Respiratory Flora No Beta Hemolytic Streptococci Isolated   POCT rapid strep A  Result Value Ref Range   Rapid Strep A Screen Negative Negative    Labs Reviewed - No data to display  No results found.     ASSESSMENT & PLAN:  1. Other mucopurulent conjunctivitis of right eye   2. Cough   3. Nasal polyps   4. Subacute maxillary sinusitis     Meds ordered this encounter  Medications  .  predniSONE (DELTASONE) 20 MG tablet    Sig: Take 3 PO QAM x3days, 2 PO QAM x3days, 1 PO QAM x3days    Dispense:  18 tablet    Refill:  0  . trimethoprim-polymyxin b (POLYTRIM) ophthalmic solution    Sig: Place 1 drop into the right eye every 4 (four) hours.    Dispense:  10 mL    Refill:  0  . triamcinolone (NASACORT) 55 MCG/ACT AERO nasal inhaler    Sig: Place 2 sprays into the nose daily.    Dispense:  1 Inhaler    Refill:  12    Reviewed expectations re: course of current medical issues. Questions answered. Outlined signs and symptoms indicating need for more acute intervention. Patient verbalized understanding. After Visit Summary  given.    Procedures:      Robyn Haber, MD 12/12/17 1702

## 2017-12-12 NOTE — ED Triage Notes (Signed)
Patient reports she had stuffy nose yesterday.  Patient was distracted when hit in the side of the face by a tree branch.  Since then right eye had some pain, but today right eye is red, watering constantly and swollen.  Denies any vision changes feels"uncomfortable in my eye"

## 2018-02-07 ENCOUNTER — Other Ambulatory Visit: Payer: Self-pay

## 2018-02-07 ENCOUNTER — Encounter: Payer: Self-pay | Admitting: Obstetrics

## 2018-02-07 ENCOUNTER — Ambulatory Visit (INDEPENDENT_AMBULATORY_CARE_PROVIDER_SITE_OTHER): Payer: BLUE CROSS/BLUE SHIELD | Admitting: Obstetrics

## 2018-02-07 ENCOUNTER — Ambulatory Visit: Payer: BLUE CROSS/BLUE SHIELD | Admitting: Obstetrics

## 2018-02-07 VITALS — BP 128/84 | HR 80 | Ht 61.0 in | Wt 175.2 lb

## 2018-02-07 DIAGNOSIS — B373 Candidiasis of vulva and vagina: Secondary | ICD-10-CM

## 2018-02-07 DIAGNOSIS — B3731 Acute candidiasis of vulva and vagina: Secondary | ICD-10-CM

## 2018-02-07 DIAGNOSIS — J301 Allergic rhinitis due to pollen: Secondary | ICD-10-CM

## 2018-02-07 DIAGNOSIS — Z1239 Encounter for other screening for malignant neoplasm of breast: Secondary | ICD-10-CM

## 2018-02-07 DIAGNOSIS — Z1151 Encounter for screening for human papillomavirus (HPV): Secondary | ICD-10-CM | POA: Diagnosis not present

## 2018-02-07 DIAGNOSIS — Z30431 Encounter for routine checking of intrauterine contraceptive device: Secondary | ICD-10-CM

## 2018-02-07 DIAGNOSIS — Z124 Encounter for screening for malignant neoplasm of cervix: Secondary | ICD-10-CM | POA: Diagnosis not present

## 2018-02-07 DIAGNOSIS — Z0000 Encounter for general adult medical examination without abnormal findings: Secondary | ICD-10-CM

## 2018-02-07 DIAGNOSIS — Z01419 Encounter for gynecological examination (general) (routine) without abnormal findings: Secondary | ICD-10-CM | POA: Diagnosis not present

## 2018-02-07 MED ORDER — FLUCONAZOLE 150 MG PO TABS: 150.0000 mg | ORAL_TABLET | Freq: Once | ORAL | 2 refills | Status: AC

## 2018-02-07 MED ORDER — TERCONAZOLE 0.8 % VA CREA
1.0000 | TOPICAL_CREAM | Freq: Every day | VAGINAL | 0 refills | Status: DC
Start: 2018-02-07 — End: 2018-03-27

## 2018-02-07 MED ORDER — PRENATAL PLUS 27-1 MG PO TABS: 1.0000 | ORAL_TABLET | Freq: Every day | ORAL | 4 refills | Status: DC

## 2018-02-07 MED ORDER — LORATADINE 10 MG PO TABS: 10.0000 mg | ORAL_TABLET | Freq: Every day | ORAL | 11 refills | Status: DC

## 2018-02-07 NOTE — Progress Notes (Signed)
Presents for AEX/PAP.  C/o thick,cream colored, itchy discharge x 5 days.  Denies abdominal pain, odor, NV.

## 2018-02-07 NOTE — Progress Notes (Signed)
Subjective:        Amy Santana is a 47 y.o. female here for a routine exam.  Current complaints: Vaginal discharge and itching..    Personal health questionnaire:  Is patient Ashkenazi Jewish, have a family history of breast and/or ovarian cancer: no Is there a family history of uterine cancer diagnosed at age < 64, gastrointestinal cancer, urinary tract cancer, family member who is a Field seismologist syndrome-associated carrier: no Is the patient overweight and hypertensive, family history of diabetes, personal history of gestational diabetes, preeclampsia or PCOS: no Is patient over 83, have PCOS,  family history of premature CHD under age 23, diabetes, smoke, have hypertension or peripheral artery disease:  no At any time, has a partner hit, kicked or otherwise hurt or frightened you?: no Over the past 2 weeks, have you felt down, depressed or hopeless?: no Over the past 2 weeks, have you felt little interest or pleasure in doing things?:no   Gynecologic History Patient's last menstrual period was 01/26/2018. Contraception: IUD Last Pap: 2014. Results were: normal Last mammogram: unknown. Results were: unknown  Obstetric History OB History  Gravida Para Term Preterm AB Living  3 3 3  0 0 3  SAB TAB Ectopic Multiple Live Births  0 0 0 0 3    # Outcome Date GA Lbr Len/2nd Weight Sex Delivery Anes PTL Lv  3 Term 07/09/13 [redacted]w[redacted]d / 00:02 8 lb 4.6 oz (3.76 kg) M Vag-Spont None  LIV     Birth Comments: None  2 Term 10/11/10 [redacted]w[redacted]d  7 lb 5 oz (3.317 kg) M Vag-Spont None  LIV  1 Term 11/15/07 [redacted]w[redacted]d  7 lb 8 oz (3.402 kg) F Vag-Spont EPI  LIV    Past Medical History:  Diagnosis Date  . No pertinent past medical history   . Uterine fibroid     Past Surgical History:  Procedure Laterality Date  . NASAL POLYP EXCISION    . WISDOM TOOTH EXTRACTION       Current Outpatient Medications:  .  Prenatal Vit-Fe Fumarate-FA (PRENATAL MULTIVITAMIN) TABS tablet, Take 1 tablet by mouth daily  at 12 noon., Disp: , Rfl:  .  azelastine (ASTELIN) 0.1 % nasal spray, Place 2 sprays into both nostrils 2 (two) times daily. Use in each nostril as directed, Disp: 30 mL, Rfl: 0 .  fluconazole (DIFLUCAN) 150 MG tablet, Take 1 tablet (150 mg total) by mouth once for 1 dose., Disp: 1 tablet, Rfl: 2 .  fluticasone (FLONASE) 50 MCG/ACT nasal spray, Place 2 sprays into both nostrils daily., Disp: 16 g, Rfl: 12 .  loratadine (CLARITIN) 10 MG tablet, Take 1 tablet (10 mg total) by mouth daily., Disp: 30 tablet, Rfl: 11 .  predniSONE (DELTASONE) 20 MG tablet, Take 3 PO QAM x3days, 2 PO QAM x3days, 1 PO QAM x3days, Disp: 18 tablet, Rfl: 0 .  terconazole (TERAZOL 3) 0.8 % vaginal cream, Place 1 applicator vaginally at bedtime., Disp: 20 g, Rfl: 0 .  triamcinolone (NASACORT) 55 MCG/ACT AERO nasal inhaler, Place 2 sprays into the nose daily., Disp: 1 Inhaler, Rfl: 12 .  trimethoprim-polymyxin b (POLYTRIM) ophthalmic solution, Place 1 drop into the right eye every 4 (four) hours., Disp: 10 mL, Rfl: 0 No Known Allergies  Social History   Tobacco Use  . Smoking status: Never Smoker  . Smokeless tobacco: Never Used  Substance Use Topics  . Alcohol use: No    Alcohol/week: 0.0 oz    Family History  Problem Relation Age of  Onset  . Hypertension Mother       Review of Systems  Constitutional: negative for fatigue and weight loss Respiratory: negative for cough and wheezing Cardiovascular: negative for chest pain, fatigue and palpitations Gastrointestinal: negative for abdominal pain and change in bowel habits Musculoskeletal:negative for myalgias Neurological: negative for gait problems and tremors Behavioral/Psych: negative for abusive relationship, depression Endocrine: negative for temperature intolerance    Genitourinary:negative for abnormal menstrual periods, genital lesions, hot flashes, sexual problems.   POSITIVE for vaginal discharge and itching Integument/breast: negative for breast lump,  breast tenderness, nipple discharge and skin lesion(s)    Objective:       BP 128/84   Pulse 80   Ht 5\' 1"  (1.549 m)   Wt 175 lb 3.2 oz (79.5 kg)   LMP 01/26/2018   BMI 33.10 kg/m  General:   alert  Skin:   no rash or abnormalities  Lungs:   clear to auscultation bilaterally  Heart:   regular rate and rhythm, S1, S2 normal, no murmur, click, rub or gallop  Breasts:   normal without suspicious masses, skin or nipple changes or axillary nodes  Abdomen:  normal findings: no organomegaly, soft, non-tender and no hernia  Pelvis:  External genitalia: normal general appearance Urinary system: urethral meatus normal and bladder without fullness, nontender Vaginal: normal without tenderness, induration or masses Cervix: normal appearance Adnexa: normal bimanual exam Uterus: anteverted and non-tender, normal size   Lab Review Urine pregnancy test Labs reviewed yes Radiologic studies reviewed no  50% of 20 min visit spent on counseling and coordination of care.   Assessment:     1. Encounter for routine gynecological examination with Papanicolaou smear of cervix Rx: - Cytology - PAP - Cervicovaginal ancillary only  2. Encounter for routine checking of intrauterine contraceptive device (IUD) - doing well  3. Candidal vulvovaginitis Rx: - fluconazole (DIFLUCAN) 150 MG tablet; Take 1 tablet (150 mg total) by mouth once for 1 dose.  Dispense: 1 tablet; Refill: 2 - terconazole (TERAZOL 3) 0.8 % vaginal cream; Place 1 applicator vaginally at bedtime.  Dispense: 20 g; Refill: 0  4. Screening breast examination Rx: - MM 3D SCREEN BREAST BILATERAL; Future  5. Seasonal allergic rhinitis due to pollen Rx: - loratadine (CLARITIN) 10 MG tablet; Take 1 tablet (10 mg total) by mouth daily.  Dispense: 30 tablet; Refill: 11  6. Routine adult health maintenance Rx: - Prenatal Vit-Fe Fumarate-FA (PRENATAL MULTIVITAMIN) TABS tablet; Take 1 tablet by mouth daily at 12 noon. - prenatal  vitamin w/FE, FA (PRENATAL 1 + 1) 27-1 MG TABS tablet; Take 1 tablet by mouth daily before breakfast.  Dispense: 90 each; Refill: 4     Plan:    Education reviewed: calcium supplements, depression evaluation, low fat, low cholesterol diet, safe sex/STD prevention, self breast exams and weight bearing exercise. Contraception: IUD. Mammogram ordered. Follow up in: 9 months.   Meds ordered this encounter  Medications  . fluconazole (DIFLUCAN) 150 MG tablet    Sig: Take 1 tablet (150 mg total) by mouth once for 1 dose.    Dispense:  1 tablet    Refill:  2  . terconazole (TERAZOL 3) 0.8 % vaginal cream    Sig: Place 1 applicator vaginally at bedtime.    Dispense:  20 g    Refill:  0  . loratadine (CLARITIN) 10 MG tablet    Sig: Take 1 tablet (10 mg total) by mouth daily.    Dispense:  30 tablet  Refill:  11   Orders Placed This Encounter  Procedures  . MM Digital Screening    Standing Status:   Future    Standing Expiration Date:   04/10/2019    Order Specific Question:   Reason for Exam (SYMPTOM  OR DIAGNOSIS REQUIRED)    Answer:   Screening.    Order Specific Question:   Is the patient pregnant?    Answer:   No    Order Specific Question:   Preferred imaging location?    Answer:   [facility]    Shelly Bombard MD 02-07-2018

## 2018-02-08 ENCOUNTER — Ambulatory Visit: Payer: BLUE CROSS/BLUE SHIELD | Admitting: Obstetrics

## 2018-02-09 LAB — CERVICOVAGINAL ANCILLARY ONLY
Bacterial vaginitis: POSITIVE — AB
CANDIDA VAGINITIS: POSITIVE — AB
Chlamydia: NEGATIVE
NEISSERIA GONORRHEA: NEGATIVE
TRICH (WINDOWPATH): NEGATIVE

## 2018-02-10 ENCOUNTER — Other Ambulatory Visit: Payer: Self-pay | Admitting: Obstetrics

## 2018-02-10 DIAGNOSIS — N760 Acute vaginitis: Principal | ICD-10-CM

## 2018-02-10 DIAGNOSIS — B9689 Other specified bacterial agents as the cause of diseases classified elsewhere: Secondary | ICD-10-CM

## 2018-02-10 MED ORDER — TINIDAZOLE 500 MG PO TABS: 1000.0000 mg | ORAL_TABLET | Freq: Every day | ORAL | 2 refills | Status: DC

## 2018-02-12 ENCOUNTER — Telehealth: Payer: Self-pay

## 2018-02-12 NOTE — Telephone Encounter (Signed)
TC from patient regarding results. Pt made aware and made aware Rx was sent.

## 2018-02-14 ENCOUNTER — Other Ambulatory Visit: Payer: Self-pay | Admitting: Obstetrics

## 2018-02-23 LAB — CYTOLOGY - PAP
Diagnosis: NEGATIVE
HPV 16/18/45 GENOTYPING: NEGATIVE
HPV: DETECTED — AB

## 2018-02-24 ENCOUNTER — Other Ambulatory Visit: Payer: Self-pay | Admitting: Obstetrics

## 2018-03-06 ENCOUNTER — Ambulatory Visit
Admission: RE | Admit: 2018-03-06 | Discharge: 2018-03-06 | Disposition: A | Discharge status: Home / Self Care | Payer: BLUE CROSS/BLUE SHIELD | Source: Ambulatory Visit | Attending: Obstetrics | Admitting: Obstetrics

## 2018-03-06 ENCOUNTER — Ambulatory Visit: Payer: BLUE CROSS/BLUE SHIELD

## 2018-03-06 DIAGNOSIS — Z1239 Encounter for other screening for malignant neoplasm of breast: Secondary | ICD-10-CM

## 2018-03-15 ENCOUNTER — Other Ambulatory Visit: Payer: Self-pay

## 2018-03-15 DIAGNOSIS — N760 Acute vaginitis: Principal | ICD-10-CM

## 2018-03-15 DIAGNOSIS — B9689 Other specified bacterial agents as the cause of diseases classified elsewhere: Secondary | ICD-10-CM

## 2018-03-15 MED ORDER — METRONIDAZOLE 0.75 % VA GEL: 1.0000 | Freq: Every day | VAGINAL | 0 refills | Status: DC

## 2018-03-15 NOTE — Progress Notes (Signed)
Pt wants vaginal Rx instead of PO method  for BV treatment. Rx sent  [place] per protocol .

## 2018-03-27 ENCOUNTER — Encounter: Payer: Self-pay | Admitting: Obstetrics

## 2018-03-27 ENCOUNTER — Ambulatory Visit (INDEPENDENT_AMBULATORY_CARE_PROVIDER_SITE_OTHER): Payer: BLUE CROSS/BLUE SHIELD | Admitting: Certified Nurse Midwife

## 2018-03-27 VITALS — BP 123/77 | HR 77 | Ht 61.0 in | Wt 174.4 lb

## 2018-03-27 DIAGNOSIS — N898 Other specified noninflammatory disorders of vagina: Secondary | ICD-10-CM | POA: Diagnosis not present

## 2018-03-27 DIAGNOSIS — B373 Candidiasis of vulva and vagina: Secondary | ICD-10-CM

## 2018-03-27 DIAGNOSIS — B3731 Acute candidiasis of vulva and vagina: Secondary | ICD-10-CM

## 2018-03-27 MED ORDER — FLUCONAZOLE 200 MG PO TABS: 200.0000 mg | ORAL_TABLET | Freq: Once | ORAL | 0 refills | Status: AC

## 2018-03-27 MED ORDER — TERCONAZOLE 0.8 % VA CREA: 1.0000 | TOPICAL_CREAM | Freq: Every day | VAGINAL | 0 refills | Status: DC

## 2018-03-27 NOTE — Progress Notes (Signed)
Presents for FU.  She is still having white, itchy discharge x 1 week.  Denies odor, pain, NV, chills or fever.  C/o Metrogel not working, she wants Stage [employer].

## 2018-03-27 NOTE — Addendum Note (Signed)
Addended by: Tamela Oddi on: 03/27/2018 02:55 PM   Modules accepted: Orders

## 2018-03-27 NOTE — Progress Notes (Signed)
Patient ID: Amy Santana, female   DOB: 06/30/1971, 47 y.o.   MRN: [id number]  Chief Complaint  Patient presents with  . Follow-up    medication management    HPI Amy Santana is a 47 y.o. female.  Here for swab, has had frequent yeast infections the last few months.  Discussed dietary changes, OTC probiotics.  Reports increased vaginal itching X1 month.    HPI  Past Medical History:  Diagnosis Date  . No pertinent past medical history   . Uterine fibroid     Past Surgical History:  Procedure Laterality Date  . NASAL POLYP EXCISION    . WISDOM TOOTH EXTRACTION      Family History  Problem Relation Age of Onset  . Hypertension Mother     Social History Social History   Tobacco Use  . Smoking status: Never Smoker  . Smokeless tobacco: Never Used  Substance Use Topics  . Alcohol use: No    Alcohol/week: 0.0 oz  . Drug use: No    No Known Allergies  Current Outpatient Medications  Medication Sig Dispense Refill  . loratadine (CLARITIN) 10 MG tablet Take 1 tablet (10 mg total) by mouth daily. 30 tablet 11  . azelastine (ASTELIN) 0.1 % nasal spray Place 2 sprays into both nostrils 2 (two) times daily. Use in each nostril as directed (Patient not taking: Reported on 03/27/2018) 30 mL 0  . fluconazole (DIFLUCAN) 200 MG tablet Take 1 tablet (200 mg total) by mouth once for 1 dose. Repeat dose in 48-72 hours. 3 tablet 0  . fluticasone (FLONASE) 50 MCG/ACT nasal spray Place 2 sprays into both nostrils daily. (Patient not taking: Reported on 03/27/2018) 16 g 12  . metroNIDAZOLE (METROGEL) 0.75 % vaginal gel Place 1 Applicatorful vaginally at bedtime. Apply one applicatorful to vagina at bedtime for 5 days (Patient not taking: Reported on 03/27/2018) 70 g 0  . predniSONE (DELTASONE) 20 MG tablet Take 3 PO QAM x3days, 2 PO QAM x3days, 1 PO QAM x3days (Patient not taking: Reported on 03/27/2018) 18 tablet 0  . Prenatal Vit-Fe Fumarate-FA (PRENATAL MULTIVITAMIN) TABS tablet  Take 1 tablet by mouth daily at 12 noon.    . prenatal vitamin w/FE, FA (PRENATAL 1 + 1) 27-1 MG TABS tablet Take 1 tablet by mouth daily before breakfast. (Patient not taking: Reported on 03/27/2018) 90 each 4  . terconazole (TERAZOL 3) 0.8 % vaginal cream Place 1 applicator vaginally at bedtime. 20 g 0  . tinidazole (TINDAMAX) 500 MG tablet Take 2 tablets (1,000 mg total) by mouth daily with breakfast. (Patient not taking: Reported on 03/27/2018) 10 tablet 2  . triamcinolone (NASACORT) 55 MCG/ACT AERO nasal inhaler Place 2 sprays into the nose daily. (Patient not taking: Reported on 03/27/2018) 1 Inhaler 12  . trimethoprim-polymyxin b (POLYTRIM) ophthalmic solution Place 1 drop into the right eye every 4 (four) hours. (Patient not taking: Reported on 03/27/2018) 10 mL 0   No current facility-administered medications for this visit.     Review of Systems Review of Systems Constitutional: negative for fatigue and weight loss Respiratory: negative for cough and wheezing Cardiovascular: negative for chest pain, fatigue and palpitations Gastrointestinal: negative for abdominal pain and change in bowel habits Genitourinary:+ vaginal discharge Integument/breast: negative for nipple discharge Musculoskeletal:negative for myalgias Neurological: negative for gait problems and tremors Behavioral/Psych: negative for abusive relationship, depression Endocrine: negative for temperature intolerance      Blood pressure 123/77, pulse 77, height 5\' 1"  (1.549 m), weight 174 lb  6.4 oz (79.1 kg), last menstrual period 02/28/2018.  Physical Exam Physical Exam General:   alert  Skin:   no rash or abnormalities  Lungs:   clear to auscultation bilaterally  Heart:   regular rate and rhythm, S1, S2 normal, no murmur, click, rub or gallop  Breasts:   deferred  Abdomen:  normal findings: no organomegaly, soft, non-tender and no hernia  Pelvis:  External genitalia: normal general appearance Urinary system: urethral  meatus normal and bladder without fullness, nontender Vaginal: normal without tenderness, induration or masses, + copious white chunky vaginal discharge noted.      50% of 15 min visit spent on counseling and coordination of care.   Data Reviewed Previous medical records, labs, meds  Assessment     1. Yeast vaginitis    - fluconazole (DIFLUCAN) 200 MG tablet; Take 1 tablet (200 mg total) by mouth once for 1 dose. Repeat dose in 48-72 hours.  Dispense: 3 tablet; Refill: 0 - terconazole (TERAZOL 3) 0.8 % vaginal cream; Place 1 applicator vaginally at bedtime.  Dispense: 20 g; Refill: 0     Plan   OTC probiotics/yogurt.    Meds ordered this encounter  Medications  . fluconazole (DIFLUCAN) 200 MG tablet    Sig: Take 1 tablet (200 mg total) by mouth once for 1 dose. Repeat dose in 48-72 hours.    Dispense:  3 tablet    Refill:  0  . terconazole (TERAZOL 3) 0.8 % vaginal cream    Sig: Place 1 applicator vaginally at bedtime.    Dispense:  20 g    Refill:  0    Follow up as needed.

## 2018-03-28 LAB — CERVICOVAGINAL ANCILLARY ONLY
Bacterial vaginitis: POSITIVE — AB
CANDIDA VAGINITIS: POSITIVE — AB

## 2018-04-04 ENCOUNTER — Other Ambulatory Visit: Payer: Self-pay | Admitting: Certified Nurse Midwife

## 2018-04-04 DIAGNOSIS — N760 Acute vaginitis: Principal | ICD-10-CM

## 2018-04-04 DIAGNOSIS — B9689 Other specified bacterial agents as the cause of diseases classified elsewhere: Secondary | ICD-10-CM

## 2018-04-04 MED ORDER — METRONIDAZOLE 0.75 % VA GEL: 1.0000 | VAGINAL | 4 refills | Status: DC

## 2018-05-05 ENCOUNTER — Encounter (HOSPITAL_COMMUNITY): Payer: Self-pay | Admitting: *Deleted

## 2018-05-05 ENCOUNTER — Other Ambulatory Visit: Payer: Self-pay

## 2018-05-05 ENCOUNTER — Ambulatory Visit (HOSPITAL_COMMUNITY)
Admission: EM | Admit: 2018-05-05 | Discharge: 2018-05-05 | Disposition: A | Discharge status: Home / Self Care | Payer: BLUE CROSS/BLUE SHIELD | Attending: Internal Medicine | Admitting: Internal Medicine

## 2018-05-05 DIAGNOSIS — B372 Candidiasis of skin and nail: Secondary | ICD-10-CM

## 2018-05-05 MED ORDER — NYSTATIN 100000 UNIT/GM EX CREA: 1.0000 "application " | TOPICAL_CREAM | Freq: Two times a day (BID) | CUTANEOUS | 2 refills | Status: DC

## 2018-05-05 NOTE — Discharge Instructions (Signed)
Please apply cream twice daily to area under breasts Please keep area clean and dry especially after showering and sweating Try to avoid itching to prevent further skin breakdown Please return if symptoms worsening, rash spreading, skin breaking down more

## 2018-05-05 NOTE — ED Triage Notes (Signed)
C/O pruritic rash beneath bilat breasts x 2 wks.

## 2018-05-06 NOTE — ED Provider Notes (Signed)
[facility]    CSN: [id number] Arrival date & time: 05/05/18  1632     History   Chief Complaint Chief Complaint  Patient presents with  . Rash    HPI Amy Santana is a 47 y.o. female no contributing PMH presenting today for evaluation of a rash. She has had a rash between bilateral breasts and associated itching for 2 weeks. She has applied steriod cream without relief. Denies previous rash similar. Denies new exposures.   HPI  Past Medical History:  Diagnosis Date  . Uterine fibroid     Patient Active Problem List   Diagnosis Date Noted  . Seasonal allergic rhinitis 01/17/2013  . GERD without esophagitis 01/17/2013  . Nasal polyp 01/04/2013    Past Surgical History:  Procedure Laterality Date  . NASAL POLYP EXCISION    . WISDOM TOOTH EXTRACTION      OB History    Gravida  3   Para  3   Term  3   Preterm  0   AB  0   Living  3     SAB  0   TAB  0   Ectopic  0   Multiple  0   Live Births  3            Home Medications    Prior to Admission medications   Medication Sig Start Date End Date Taking? Authorizing Provider  loratadine (CLARITIN) 10 MG tablet Take 1 tablet (10 mg total) by mouth daily. 02/07/18  Yes Shelly Bombard, MD  nystatin cream (MYCOSTATIN) Apply 1 application topically 2 (two) times daily. 05/05/18   Wieters, Elesa Hacker, PA-C    Family History Family History  Problem Relation Age of Onset  . Hypertension Mother     Social History Social History   Tobacco Use  . Smoking status: Never Smoker  . Smokeless tobacco: Never Used  Substance Use Topics  . Alcohol use: No    Alcohol/week: 0.0 standard drinks  . Drug use: No     Allergies   Patient has no known allergies.   Review of Systems Review of Systems  Constitutional: Negative for fatigue and fever.  Eyes: Negative for visual disturbance.  Respiratory: Negative for shortness of breath.   Cardiovascular: Negative for chest pain.    Gastrointestinal: Negative for abdominal pain, nausea and vomiting.  Musculoskeletal: Negative for arthralgias and joint swelling.  Skin: Positive for color change and rash. Negative for wound.  Neurological: Negative for dizziness, weakness, light-headedness and headaches.     Physical Exam Triage Vital Signs ED Triage Vitals  Enc Vitals Group     BP 05/05/18 1736 134/79     Pulse Rate 05/05/18 1736 66     Resp 05/05/18 1736 18     Temp 05/05/18 1736 98 F (36.7 C)     Temp Source 05/05/18 1736 Oral     SpO2 05/05/18 1736 98 %     Weight --      Height --      Head Circumference --      Peak Flow --      Pain Score 05/05/18 1737 0     Pain Loc --      Pain Edu? --      Excl. in [place]? --    No data found.  Updated Vital Signs BP 134/79   Pulse 66   Temp 98 F (36.7 C) (Oral)   Resp 18   SpO2 98%  Visual Acuity Right Eye Distance:   Left Eye Distance:   Bilateral Distance:    Right Eye Near:   Left Eye Near:    Bilateral Near:     Physical Exam  Constitutional: She is oriented to person, place, and time. She appears well-developed and well-nourished.  No acute distress  HENT:  Head: Normocephalic and atraumatic.  Nose: Nose normal.  Eyes: Conjunctivae are normal.  Neck: Neck supple.  Cardiovascular: Normal rate.  Pulmonary/Chest: Effort normal. No respiratory distress.  Abdominal: She exhibits no distension.  Musculoskeletal: Normal range of motion.  Neurological: She is alert and oriented to person, place, and time.  Skin: Skin is warm and dry.  Patient with whitish/hyperpigmentation in an "x" distribution between breasts, mild skin breakdown on edges, patient frequently scratching during visit.   See picture below  Psychiatric: She has a normal mood and affect.  Nursing note and vitals reviewed.      UC Treatments / Results  Labs (all labs ordered are listed, but only abnormal results are displayed) Labs Reviewed - No data to  display  EKG None  Radiology No results found.  Procedures Procedures (including critical care time)  Medications Ordered in UC Medications - No data to display  Initial Impression / Assessment and Plan / UC Course  I have reviewed the triage vital signs and the nursing notes.  Pertinent labs & imaging results that were available during my care of the patient were reviewed by me and considered in my medical decision making (see chart for details).     Patient appears to have yeast infection. Will treat with nystatin cream, discussed drying measures, avoid scratching to prevent superimposed bacterial infection ontop with skin breakdown. Discussed strict return precautions. Patient verbalized understanding and is agreeable with plan.  Final Clinical Impressions(s) / UC Diagnoses   Final diagnoses:  Yeast dermatitis     Discharge Instructions     Please apply cream twice daily to area under breasts Please keep area clean and dry especially after showering and sweating Try to avoid itching to prevent further skin breakdown Please return if symptoms worsening, rash spreading, skin breaking down more   ED Prescriptions    Medication Sig Dispense Auth. Provider   nystatin cream (MYCOSTATIN) Apply 1 application topically 2 (two) times daily. 30 g Wieters, Benedict C, PA-C     Controlled Substance Prescriptions [place] Controlled Substance Registry consulted? Not Applicable   Janith Lima, [place] 05/06/18 [phone]

## 2018-06-30 ENCOUNTER — Ambulatory Visit (HOSPITAL_COMMUNITY)
Admission: EM | Admit: 2018-06-30 | Discharge: 2018-06-30 | Disposition: A | Discharge status: Home / Self Care | Payer: BLUE CROSS/BLUE SHIELD | Attending: Family Medicine | Admitting: Family Medicine

## 2018-06-30 ENCOUNTER — Other Ambulatory Visit: Payer: Self-pay

## 2018-06-30 ENCOUNTER — Encounter (HOSPITAL_COMMUNITY): Payer: Self-pay | Admitting: *Deleted

## 2018-06-30 DIAGNOSIS — J310 Chronic rhinitis: Secondary | ICD-10-CM | POA: Diagnosis not present

## 2018-06-30 DIAGNOSIS — J339 Nasal polyp, unspecified: Secondary | ICD-10-CM

## 2018-06-30 MED ORDER — CETIRIZINE HCL 10 MG PO TABS: 10.0000 mg | ORAL_TABLET | Freq: Every day | ORAL | 0 refills | Status: DC

## 2018-06-30 MED ORDER — AMOXICILLIN-POT CLAVULANATE 875-125 MG PO TABS: 1.0000 | ORAL_TABLET | Freq: Two times a day (BID) | ORAL | 0 refills | Status: AC

## 2018-06-30 MED ORDER — IPRATROPIUM BROMIDE 0.06 % NA SOLN: 2.0000 | Freq: Four times a day (QID) | NASAL | 12 refills | Status: DC

## 2018-06-30 MED ORDER — FLUTICASONE PROPIONATE 50 MCG/ACT NA SUSP: 1.0000 | Freq: Every day | NASAL | 2 refills | Status: DC

## 2018-06-30 MED ORDER — GUAIFENESIN ER 600 MG PO TB12: 1200.0000 mg | ORAL_TABLET | Freq: Two times a day (BID) | ORAL | 0 refills | Status: AC | PRN

## 2018-06-30 NOTE — ED Provider Notes (Signed)
[facility]    CSN: [id number] Arrival date & time: 06/30/18  1144     History   Chief Complaint Chief Complaint  Patient presents with  . Nasal Congestion    HPI Amy Santana is a 47 y.o. female.   Aneta presents with complaints of 4 days of congstion, facial pressure, post nasal drip, productive cough. No fevers. No sore throat. Right ear pain after blowing her nose too hard. No gi/gu complaints. No known ill contacts. Son with similar illness last week. Hx of nasal polyp, states she sometimes uses nasal spray. Has been using ibuprofen and claritin. Ibuprofen last at 1045 today. Hasn't seemed to help. Hx of allergic rhinitis, gerd.    ROS per HPI.      Past Medical History:  Diagnosis Date  . Uterine fibroid     Patient Active Problem List   Diagnosis Date Noted  . Seasonal allergic rhinitis 01/17/2013  . GERD without esophagitis 01/17/2013  . Nasal polyp 01/04/2013    Past Surgical History:  Procedure Laterality Date  . NASAL POLYP EXCISION    . WISDOM TOOTH EXTRACTION      OB History    Gravida  3   Para  3   Term  3   Preterm  0   AB  0   Living  3     SAB  0   TAB  0   Ectopic  0   Multiple  0   Live Births  3            Home Medications    Prior to Admission medications   Medication Sig Start Date End Date Taking? Authorizing Provider  amoxicillin-clavulanate (AUGMENTIN) 875-125 MG tablet Take 1 tablet by mouth every 12 (twelve) hours for 10 days. 07/04/18 07/14/18  Zigmund Gottron, NP  cetirizine (ZYRTEC) 10 MG tablet Take 1 tablet (10 mg total) by mouth daily. 06/30/18   Zigmund Gottron, NP  fluticasone (FLONASE) 50 MCG/ACT nasal spray Place 1 spray into both nostrils daily. 06/30/18   Zigmund Gottron, NP  guaiFENesin (MUCINEX) 600 MG 12 hr tablet Take 2 tablets (1,200 mg total) by mouth 2 (two) times daily as needed for up to 5 days. 06/30/18 07/05/18  Augusto Gamble B, NP  ipratropium (ATROVENT) 0.06 %  nasal spray Place 2 sprays into both nostrils 4 (four) times daily. 06/30/18   Zigmund Gottron, NP    Family History Family History  Problem Relation Age of Onset  . Hypertension Mother     Social History Social History   Tobacco Use  . Smoking status: Never Smoker  . Smokeless tobacco: Never Used  Substance Use Topics  . Alcohol use: No    Alcohol/week: 0.0 standard drinks  . Drug use: No     Allergies   Patient has no known allergies.   Review of Systems Review of Systems   Physical Exam Triage Vital Signs ED Triage Vitals [06/30/18 1222]  Enc Vitals Group     BP 122/74     Pulse Rate 67     Resp 14     Temp 98.1 F (36.7 C)     Temp Source Oral     SpO2 100 %     Weight      Height      Head Circumference      Peak Flow      Pain Score      Pain Loc  Pain Edu?      Excl. in [place]?    No data found.  Updated Vital Signs BP 122/74   Pulse 67   Temp 98.1 F (36.7 C) (Oral)   Resp 14   SpO2 100%    Physical Exam  Constitutional: She is oriented to person, place, and time. She appears well-developed and well-nourished. No distress.  HENT:  Head: Normocephalic and atraumatic.  Right Ear: Tympanic membrane, external ear and ear canal normal.  Left Ear: Tympanic membrane, external ear and ear canal normal.  Nose: Mucosal edema and rhinorrhea present.  Mouth/Throat: Uvula is midline, oropharynx is clear and moist and mucous membranes are normal. No tonsillar exudate.  Significant bilateral nasal mucosal edema l>r; mild left sided maxillary sinus tenderness   Eyes: Pupils are equal, round, and reactive to light. Conjunctivae and EOM are normal.  Cardiovascular: Normal rate, regular rhythm and normal heart sounds.  Pulmonary/Chest: Effort normal and breath sounds normal.  Neurological: She is alert and oriented to person, place, and time.  Skin: Skin is warm and dry.     UC Treatments / Results  Labs (all labs ordered are listed, but only  abnormal results are displayed) Labs Reviewed - No data to display  EKG None  Radiology No results found.  Procedures Procedures (including critical care time)  Medications Ordered in UC Medications - No data to display  Initial Impression / Assessment and Plan / UC Course  I have reviewed the triage vital signs and the nursing notes.  Pertinent labs & imaging results that were available during my care of the patient were reviewed by me and considered in my medical decision making (see chart for details).     Non toxic in appearance. Afebrile. History and physical consistent with viral illness vs allergic rhinitis. Encouraged regular use of nasal spray. Polyp and swelling with mild maxillary sinus tenderness. augmentin provided to start next week if worsening of symptoms due to risk of sinus infection. Continue with supportive cares. If symptoms worsen or do not improve in the next week to return to be seen or to follow up with PCP.  Patient verbalized understanding and agreeable to plan.   Final Clinical Impressions(s) / UC Diagnoses   Final diagnoses:  Rhinitis, unspecified type  Nasal polyp     Discharge Instructions     Push fluids to ensure adequate hydration and keep secretions thin.  Tylenol and/or ibuprofen as needed for pain or fevers.  Nasal spray daily and regularly to help with congestion and polyp size.  Daily zyrtec or claritin.  May start antibiotics if worsening or no improvement next Wednesday.  If symptoms worsen or do not improve in the next week to return to be seen or to follow up with your PCP.      ED Prescriptions    Medication Sig Dispense Auth. Provider   fluticasone (FLONASE) 50 MCG/ACT nasal spray Place 1 spray into both nostrils daily. 16 g Augusto Gamble B, NP   ipratropium (ATROVENT) 0.06 % nasal spray Place 2 sprays into both nostrils 4 (four) times daily. 15 mL Augusto Gamble B, NP   guaiFENesin (MUCINEX) 600 MG 12 hr tablet Take 2  tablets (1,200 mg total) by mouth 2 (two) times daily as needed for up to 5 days. 20 tablet Augusto Gamble B, NP   cetirizine (ZYRTEC) 10 MG tablet Take 1 tablet (10 mg total) by mouth daily. 30 tablet Augusto Gamble B, NP   amoxicillin-clavulanate (AUGMENTIN) 875-125 MG tablet  Take 1 tablet by mouth every 12 (twelve) hours for 10 days. 20 tablet Zigmund Gottron, NP     Controlled Substance Prescriptions [place] Controlled Substance Registry consulted? Not Applicable   Zigmund Gottron, NP 06/30/18 1306

## 2018-06-30 NOTE — ED Triage Notes (Signed)
Pt c/o congestion, runny nose, facial pain x 4 days.

## 2018-06-30 NOTE — Discharge Instructions (Addendum)
Push fluids to ensure adequate hydration and keep secretions thin.  Tylenol and/or ibuprofen as needed for pain or fevers.  Nasal spray daily and regularly to help with congestion and polyp size.  Daily zyrtec or claritin.  May start antibiotics if worsening or no improvement next Wednesday.  If symptoms worsen or do not improve in the next week to return to be seen or to follow up with your PCP.

## 2018-10-01 ENCOUNTER — Ambulatory Visit (INDEPENDENT_AMBULATORY_CARE_PROVIDER_SITE_OTHER): Payer: BLUE CROSS/BLUE SHIELD

## 2018-10-01 VITALS — BP 122/78 | HR 78 | Resp 16 | Ht 61.0 in | Wt 172.3 lb

## 2018-10-01 DIAGNOSIS — Z124 Encounter for screening for malignant neoplasm of cervix: Secondary | ICD-10-CM

## 2018-10-01 DIAGNOSIS — N898 Other specified noninflammatory disorders of vagina: Secondary | ICD-10-CM

## 2018-10-01 DIAGNOSIS — Z113 Encounter for screening for infections with a predominantly sexual mode of transmission: Secondary | ICD-10-CM | POA: Diagnosis not present

## 2018-10-01 DIAGNOSIS — B373 Candidiasis of vulva and vagina: Secondary | ICD-10-CM

## 2018-10-01 DIAGNOSIS — R300 Dysuria: Secondary | ICD-10-CM

## 2018-10-01 LAB — POCT URINALYSIS DIP (MANUAL ENTRY)
Bilirubin, UA: NEGATIVE
GLUCOSE UA: NEGATIVE mg/dL
Ketones, POC UA: NEGATIVE mg/dL
Nitrite, UA: NEGATIVE
Protein Ur, POC: NEGATIVE mg/dL
Spec Grav, UA: 1.01 (ref 1.010–1.025)
UROBILINOGEN UA: 0.2 U/dL
pH, UA: 7 (ref 5.0–8.0)

## 2018-10-01 NOTE — Progress Notes (Signed)
SUBJECTIVE:  48 y.o. female complains of white, odorless and thick vaginal discharge that she notice today. Patient reports vaginal itching for the last week as well. Patient reports she has tried OTC Monistat with no relief. Patient reports Urination burning for only one day about four days ago.  Patient is requesting a medication refill on Clotrimazole (Lotrimin) 1% cream - topically for the vaginal itching - patient states this worked very well for her. Denies abnormal vaginal bleeding or significant pelvic pain or fever.  Denies history of known exposure to STD.  No LMP recorded. (Menstrual status: IUD).  OBJECTIVE:  She appears well, afebrile. Urine dipstick: positive for WBC's and positive for RBC's  ASSESSMENT:  Vaginal Itching; Vaginal discharge; Dysuria   PLAN:  Patient advised she will be contacted by our office once lab results are received and reviewed by the provider for recommendations or instructions. Labs: Cervicovaginal ancillary - GC, chlamydia, trichomonas, BVAG, CVAG probe sent to lab. Urine culture sent to lab. Treatment: To be determined once lab results are received ROV prn if symptoms persist or worsen.

## 2018-10-02 LAB — CERVICOVAGINAL ANCILLARY ONLY
BACTERIAL VAGINITIS: NEGATIVE
Candida vaginitis: POSITIVE — AB
Chlamydia: NEGATIVE
NEISSERIA GONORRHEA: NEGATIVE
TRICH (WINDOWPATH): NEGATIVE

## 2018-10-03 ENCOUNTER — Other Ambulatory Visit: Payer: Self-pay | Admitting: Obstetrics

## 2018-10-03 ENCOUNTER — Other Ambulatory Visit: Payer: Self-pay | Admitting: *Deleted

## 2018-10-03 DIAGNOSIS — B369 Superficial mycosis, unspecified: Secondary | ICD-10-CM

## 2018-10-03 DIAGNOSIS — B373 Candidiasis of vulva and vagina: Secondary | ICD-10-CM

## 2018-10-03 DIAGNOSIS — B3731 Acute candidiasis of vulva and vagina: Secondary | ICD-10-CM

## 2018-10-03 LAB — URINE CULTURE

## 2018-10-03 MED ORDER — FLUCONAZOLE 150 MG PO TABS: 150.0000 mg | ORAL_TABLET | Freq: Once | ORAL | 0 refills | Status: AC

## 2018-10-03 MED ORDER — CLOTRIMAZOLE 1 % EX CREA: 1.0000 "application " | TOPICAL_CREAM | Freq: Two times a day (BID) | CUTANEOUS | 1 refills | Status: DC

## 2018-11-08 ENCOUNTER — Encounter: Payer: Self-pay | Admitting: Obstetrics

## 2018-11-08 ENCOUNTER — Ambulatory Visit: Payer: BLUE CROSS/BLUE SHIELD | Admitting: Obstetrics

## 2018-11-08 VITALS — BP 118/81 | HR 87 | Resp 16 | Wt 173.0 lb

## 2018-11-08 DIAGNOSIS — B373 Candidiasis of vulva and vagina: Secondary | ICD-10-CM | POA: Diagnosis not present

## 2018-11-08 DIAGNOSIS — Z3202 Encounter for pregnancy test, result negative: Secondary | ICD-10-CM

## 2018-11-08 DIAGNOSIS — Z975 Presence of (intrauterine) contraceptive device: Secondary | ICD-10-CM

## 2018-11-08 DIAGNOSIS — B3731 Acute candidiasis of vulva and vagina: Secondary | ICD-10-CM

## 2018-11-08 DIAGNOSIS — Z538 Procedure and treatment not carried out for other reasons: Secondary | ICD-10-CM | POA: Diagnosis not present

## 2018-11-08 DIAGNOSIS — T8332XA Displacement of intrauterine contraceptive device, initial encounter: Secondary | ICD-10-CM | POA: Diagnosis not present

## 2018-11-08 DIAGNOSIS — Z01818 Encounter for other preprocedural examination: Secondary | ICD-10-CM

## 2018-11-08 LAB — POCT URINE PREGNANCY: Preg Test, Ur: NEGATIVE

## 2018-11-08 MED ORDER — LEVONORGESTREL 20 MCG/24HR IU IUD: 1.0000 | INTRAUTERINE_SYSTEM | Freq: Once | INTRAUTERINE | 0 refills | Status: DC

## 2018-11-08 MED ORDER — TERCONAZOLE 0.8 % VA CREA: 1.0000 | TOPICAL_CREAM | Freq: Every day | VAGINAL | 2 refills | Status: DC

## 2018-11-08 MED ORDER — FLUCONAZOLE 150 MG PO TABS: 150.0000 mg | ORAL_TABLET | Freq: Once | ORAL | 2 refills | Status: AC

## 2018-11-08 NOTE — Progress Notes (Signed)
Pt presents for IUD removal and reinsertion. IUD expired in January.

## 2018-11-08 NOTE — Progress Notes (Signed)
    GYNECOLOGY OFFICE PROCEDURE NOTE  Chrissie Dacquisto is a 48 y.o. [id number] here for [place] IUD removal. No GYN concerns.    Unsuccessful Attempt to Remove IUD Patient identified, informed consent performed, consent signed.  Patient was in the dorsal lithotomy position, normal external genitalia was noted.  A speculum was placed in the patient's vagina, normal discharge was noted, no lesions. The cervix was visualized, no lesions, no abnormal discharge.  The strings of the IUD were were not visible, so Kelly forceps were introduced into the endometrial cavity and the IUD was not able to be grasped.  Patient had a very low pain thresh hold and did not tolerate the procedure well.    Patient was scheduled for pelvic transvaginal ultrasound to identify location of IUD Follow up in 2 weeks   Shelly Bombard , MD, [place], [facility], [facility] 11-08-2018

## 2018-11-14 ENCOUNTER — Other Ambulatory Visit: Payer: BLUE CROSS/BLUE SHIELD

## 2018-11-16 ENCOUNTER — Ambulatory Visit
Admission: RE | Admit: 2018-11-16 | Discharge: 2018-11-16 | Disposition: A | Discharge status: Home / Self Care | Payer: BLUE CROSS/BLUE SHIELD | Source: Ambulatory Visit | Attending: Obstetrics | Admitting: Obstetrics

## 2018-11-16 DIAGNOSIS — Z538 Procedure and treatment not carried out for other reasons: Secondary | ICD-10-CM

## 2018-11-16 DIAGNOSIS — Z975 Presence of (intrauterine) contraceptive device: Principal | ICD-10-CM

## 2018-11-16 DIAGNOSIS — T8332XA Displacement of intrauterine contraceptive device, initial encounter: Secondary | ICD-10-CM

## 2018-11-28 ENCOUNTER — Ambulatory Visit: Payer: BLUE CROSS/BLUE SHIELD | Admitting: Obstetrics

## 2018-11-28 ENCOUNTER — Encounter: Payer: Self-pay | Admitting: Obstetrics

## 2018-11-28 VITALS — BP 127/83 | HR 71 | Wt 173.0 lb

## 2018-11-28 DIAGNOSIS — Z538 Procedure and treatment not carried out for other reasons: Secondary | ICD-10-CM

## 2018-11-28 DIAGNOSIS — T8332XD Displacement of intrauterine contraceptive device, subsequent encounter: Secondary | ICD-10-CM | POA: Diagnosis not present

## 2018-11-28 DIAGNOSIS — Z975 Presence of (intrauterine) contraceptive device: Secondary | ICD-10-CM | POA: Diagnosis not present

## 2018-11-28 NOTE — Progress Notes (Signed)
Pt is here for f/u after [place] on 11/16/18 to find her IUD.

## 2018-11-28 NOTE — Progress Notes (Signed)
Patient ID: Amy Santana, female   DOB: 1971-01-19, 48 y.o.   MRN: [id number]  Chief Complaint  Patient presents with  . Follow-up    HPI Amy Santana is a 47 y.o. female.  Patient presents for discussion of ultrasound done for IUD identification after unsuccessful attempt to remove IUD, with strings not visible. HPI  Past Medical History:  Diagnosis Date  . Uterine fibroid     Past Surgical History:  Procedure Laterality Date  . NASAL POLYP EXCISION    . WISDOM TOOTH EXTRACTION      Family History  Problem Relation Age of Onset  . Hypertension Mother     Social History Social History   Tobacco Use  . Smoking status: Never Smoker  . Smokeless tobacco: Never Used  Substance Use Topics  . Alcohol use: No    Alcohol/week: 0.0 standard drinks  . Drug use: No    No Known Allergies  Current Outpatient Medications  Medication Sig Dispense Refill  . cetirizine (ZYRTEC) 10 MG tablet Take 1 tablet (10 mg total) by mouth daily. (Patient not taking: Reported on 11/28/2018) 30 tablet 0  . clotrimazole (LOTRIMIN) 1 % cream Apply 1 application topically 2 (two) times daily. (Patient not taking: Reported on 11/28/2018) 60 g 1  . fluticasone (FLONASE) 50 MCG/ACT nasal spray Place 1 spray into both nostrils daily. (Patient not taking: Reported on 11/28/2018) 16 g 2  . levonorgestrel (MIRENA) 20 MCG/24HR IUD 1 Intra Uterine Device (1 each total) by Intrauterine route once for 1 dose. 1 each 0  . terconazole (TERAZOL 3) 0.8 % vaginal cream Place 1 applicator vaginally at bedtime. (Patient not taking: Reported on 11/28/2018) 20 g 2   No current facility-administered medications for this visit.     Review of Systems Review of Systems Constitutional: negative for fatigue and weight loss Respiratory: negative for cough and wheezing Cardiovascular: negative for chest pain, fatigue and palpitations Gastrointestinal: negative for abdominal pain and change in bowel  habits Genitourinary:negative Integument/breast: negative for nipple discharge Musculoskeletal:negative for myalgias Neurological: negative for gait problems and tremors Behavioral/Psych: negative for abusive relationship, depression Endocrine: negative for temperature intolerance      Blood pressure 127/83, pulse 71, weight 173 lb (78.5 kg).  Physical Exam Physical Exam Abdomen:  normal findings: no organomegaly, soft, non-tender and no hernia  Pelvis:  External genitalia: normal general appearance Urinary system: urethral meatus normal and bladder without fullness, nontender Vaginal: normal without tenderness, induration or masses Cervix: normal appearance.  IUD strings not visible.  Consent obtained and an attempt to remove IUD was again unsuccessful Adnexa: normal bimanual exam Uterus: anteverted and non-tender, normal size    50% of 20 min visit spent on counseling and coordination of care.   Data Reviewed ULTRASOUND: US PELVIC COMPLETE WITH TRANSVAGINAL (Accession [id number]) (Order [id number])  Imaging  Date: 11/16/2018 Department: [facility] Released By: Matt Holmes Authorizing: Shelly Bombard, MD  Exam Information   Status Exam Begun  Exam Ended   Final [99] 11/16/2018 4:22 PM 11/16/2018 5:16 PM  PACS Images   Show images for US PELVIC COMPLETE WITH TRANSVAGINAL  Study Result   CLINICAL DATA:  IUD, lost strings, unsuccessful attempt to remove IUD  EXAM: TRANSABDOMINAL AND TRANSVAGINAL ULTRASOUND OF PELVIS  TECHNIQUE: Both transabdominal and transvaginal ultrasound examinations of the pelvis were performed. Transabdominal technique was performed for global imaging of the pelvis including uterus, ovaries, adnexal regions, and pelvic cul-de-sac. It was necessary to proceed with endovaginal  exam following the transabdominal exam to visualize the IUD and endometrium.  COMPARISON:   None  FINDINGS: Uterus  Measurements: 14.0 x 5.8 x 12.0 cm = volume: 504 mL. Enlarged, heterogeneous and nodular containing multiple leiomyomata. These include a LEFT fundal leiomyoma 3.1 x 3.1 x 3.3 cm, pedunculated RIGHT fundal leiomyoma 3.7 x 3.0 x 2.2 cm, and a LEFT mid submucosal leiomyoma 3.6 x 3.5 x 3.9 cm.  Endometrium  Thickness: Inadequately visualized, question 4 mm thick. Several echogenic foci with shadowing are seen centrally in the upper uterus, potentially representing an IUD period, however, this is inadequately visualized to confirm this is an IUD and to identify its orientation.  Right ovary  Measurements: 6.0 x 3.4 x 5.6 cm = volume: 60 mL. Cyst within RIGHT ovary 4.8 x 3.0 x 4.6 cm containing scattered internal echogenicity versus artifact  Left ovary  Measurements: 3.1 x 2.0 x 3.6 cm = volume: 11.6 mL. Normal morphology without mass  Other findings  No free pelvic fluid.  No adnexal masses.  IMPRESSION: Enlarged nodular and heterogeneous uterus containing multiple leiomyomata.  Inadequate visualization of the endometrial complex.  Potential visualization of an IUD though this is inadequately seen due to artifacts related to multiple uterine leiomyomata; recommend confirmation of the presence of an IUD by abdominal radiograph.   Electronically Signed   By: Lavonia Dana M.D.   On: 11/16/2018 18:20     Assessment     1. Intrauterine contraceptive device threads lost, subsequent encounter - ultrasound suggests IUD is present but could not conclusively fully identify IUD, and recommended CT or MRI   2. Unsuccessful attempt to remove intrauterine device (IUD) - ultrasound results explained and options given for further radiologic evaluation or outpatient Hysteroscopy with IUD Removal - patient indicated that she will consider options and call office with decision - informed patient that her IUD expires this month and she should  use another contraceptive method    Plan    Follow up prn and in 6 months for Annual  No orders of the defined types were placed in this encounter.  No orders of the defined types were placed in this encounter.   Shelly Bombard MD 11-28-2018

## 2019-02-06 ENCOUNTER — Telehealth (INDEPENDENT_AMBULATORY_CARE_PROVIDER_SITE_OTHER): Payer: BLUE CROSS/BLUE SHIELD | Admitting: Family Medicine

## 2019-02-06 ENCOUNTER — Ambulatory Visit: Payer: Self-pay

## 2019-02-06 ENCOUNTER — Other Ambulatory Visit: Payer: Self-pay

## 2019-02-06 ENCOUNTER — Encounter: Payer: Self-pay | Admitting: Family Medicine

## 2019-02-06 ENCOUNTER — Telehealth: Payer: Self-pay | Admitting: Family Medicine

## 2019-02-06 VITALS — Ht 61.0 in | Wt 169.0 lb

## 2019-02-06 DIAGNOSIS — R6883 Chills (without fever): Secondary | ICD-10-CM | POA: Diagnosis not present

## 2019-02-06 DIAGNOSIS — R52 Pain, unspecified: Secondary | ICD-10-CM

## 2019-02-06 DIAGNOSIS — J309 Allergic rhinitis, unspecified: Secondary | ICD-10-CM | POA: Diagnosis not present

## 2019-02-06 MED ORDER — FLUTICASONE PROPIONATE 50 MCG/ACT NA SUSP: 1.0000 | Freq: Every day | NASAL | 2 refills | Status: DC

## 2019-02-06 NOTE — Patient Instructions (Addendum)
Ok to start flonase for possible allergies, and continue other allergy medicine.   With the chills/shivering last night and body aches, I would like you to remain at home, isolate as much as possible for now.  Please check your temperature twice per day, and we will discuss those readings when I follow-up with you in 2 days.  COVID-19 is less likely without true fever, but could certainly be early symptoms.  I will schedule you a visit in 2 days and we can discuss those symptoms further and determine at that time if okay to return to work.  For now I do recommend you stay out of work, wear mask at all times around others, and see other precautions from the CDC below.  Return to the clinic or go to the nearest emergency room if any of your symptoms worsen or new symptoms occur.  This information is directly available on the CDC website: RunningShows.co.za.html    Source:CDC Reference to specific commercial products, manufacturers, companies, or trademarks does not constitute its endorsement or recommendation by the [place], [place], or Centers for [employer] and Prevention.

## 2019-02-06 NOTE — Telephone Encounter (Signed)
02/06/2019 - PATIENT HAD A TELEMED OFFICE VISIT WITH DR. Carlota Raspberry ON WED (02/06/2019). DR. Carlota Raspberry HAS REQUESTED SHE HAVE ANOTHER TELEMED IN 2 DAYS (FRI. 02/08/2019) TO RECHECK HER CONGESTION, CHILLS, SHIVERING AND BODY ACHES. I TRIED TO CALL AND SCHEDULE BUT HAD TO LEAVE HER A VOICE MAIL TO RETURN OUR CALL. [place]

## 2019-02-06 NOTE — Progress Notes (Signed)
Virtual Visit via Telephone Note  I connected with Amy Santana on 02/06/19 at 2:56 PM by telephone and verified that I am speaking with the correct person using two identifiers.   I discussed the limitations, risks, security and privacy concerns of performing an evaluation and management service by telephone and the availability of in person appointments. I also discussed with the patient that there may be a patient responsible charge related to this service. The patient expressed understanding and agreed to proceed, consent obtained.  Initial call - pt shopping in [place] - called back at 3:25 PM for more private time to talk.   Chief complaint:   Nasal congestion  History of Present Illness: Amy Santana is a 48 y.o. female  History of seasonal allergic rhinitis, nasal polyps per problem list. Polyps seem to enlarge during allergy season. More congested yesterday - had to breathe through mouth overnight, more sneezing, eye watering, runny nose since yesterday. Sore throat this morning. Still some sore throat.  No fever.  No cough.  Slight bodyache today. No headache.  No dyspnea.  No n/v, no diarrhea.  Slight decrease in smell due to congestion. Did have some chills/shivering since last night. Did not check temperature. No chills or shivering today.  Has taken over the counter allergy pill since April, has not needed flonase nasal spray in past month - sx's had been controlled until last night.  Thinks chills and bodyache may be due to less sleep lately. Feeling some fatigue - but thinks not sleeping enough.  Did go to work today and temp was normal when checked at job.   Last week - had minimal nasal congestion. Much worse last night.   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 No known sick contacts and no known exposure to [place].    Patient Active Problem List   Diagnosis Date Noted  . Seasonal allergic rhinitis 01/17/2013  . GERD without esophagitis 01/17/2013   . Nasal polyp 01/04/2013   Past Medical History:  Diagnosis Date  . Uterine fibroid    Past Surgical History:  Procedure Laterality Date  . NASAL POLYP EXCISION    . WISDOM TOOTH EXTRACTION     No Known Allergies Prior to Admission medications   Medication Sig Start Date End Date Taking? Authorizing Provider  cetirizine (ZYRTEC) 10 MG tablet Take 1 tablet (10 mg total) by mouth daily. Patient not taking: Reported on 11/28/2018 06/30/18   Augusto Gamble B, NP  clotrimazole (LOTRIMIN) 1 % cream Apply 1 application topically 2 (two) times daily. Patient not taking: Reported on 11/28/2018 10/03/18   Shelly Bombard, MD  fluticasone [place]) 50 MCG/ACT nasal spray Place 1 spray into both nostrils daily. Patient not taking: Reported on 11/28/2018 06/30/18   Zigmund Gottron, NP  levonorgestrel (MIRENA) 20 MCG/24HR IUD 1 Intra Uterine Device (1 each total) by Intrauterine route once for 1 dose. 11/08/18 11/08/18  Shelly Bombard, MD  terconazole (TERAZOL 3) 0.8 % vaginal cream Place 1 applicator vaginally at bedtime. Patient not taking: Reported on 11/28/2018 11/08/18   Shelly Bombard, MD   Social History   Socioeconomic History  . Marital status: Legally Separated    Spouse name: Amy Santana  . Number of children: 3  . Years of education: Not on file  . Highest education level: Bachelor's degree (e.g., BA, AB, BS)  Occupational History  . Occupation: Cytogeneticist    Comment: [employer]  . Financial resource strain: Not on file  .  Food insecurity:    Worry: Not on file    Inability: Not on file  . Transportation needs:    Medical: Not on file    Non-medical: Not on file  Tobacco Use  . Smoking status: Never Smoker  . Smokeless tobacco: Never Used  Substance and Sexual Activity  . Alcohol use: No    Alcohol/week: 0.0 standard drinks  . Drug use: No  . Sexual activity: Not on file  Lifestyle  . Physical activity:    Days per week: Not on file     Minutes per session: Not on file  . Stress: Not on file  Relationships  . Social connections:    Talks on phone: Not on file    Gets together: Not on file    Attends religious service: Not on file    Active member of club or organization: Not on file    Attends meetings of clubs or organizations: Not on file    Relationship status: Not on file  . Intimate partner violence:    Fear of current or ex partner: Not on file    Emotionally abused: Not on file    Physically abused: Not on file    Forced sexual activity: Not on file  Other Topics Concern  . Not on file  Social History Narrative   From [place].   Came to the [place] at age 64.   Lives with her mother and her 3 children.   Separated from her husband.     Observations/Objective: Speaking full sentences, no respiratory distress, appropriate responses.  Assessment and Plan: Allergic rhinitis, unspecified seasonality, unspecified trigger  Chills  Shivering  Body aches  Initially suspected allergic rhinitis flare, but has been well controlled over the past few weeks, acute worsening last night with increased congestion, and does report chills/shivering and slight body aches today.  No true fever, no other COVID-19 symptoms at this time but discussed potential early or mild symptoms.  -Okay to restart Flonase, continue antihistamine, fluids, rest  -Out of work for now and isolation/contact precautions were discussed while monitoring symptoms next 2 days.  Recommended temperature monitoring twice per day for the next 2 days with repeat evaluation in 48 hours.  -ER precautions discussed, understanding expressed.  Follow Up Instructions:   2 days.   I discussed the assessment and treatment plan with the patient. The patient was provided an opportunity to ask questions and all were answered. The patient agreed with the plan and demonstrated an understanding of the instructions.   The patient was advised to call back or seek an  in-person evaluation if the symptoms worsen or if the condition fails to improve as anticipated.  I provided 16 minutes of non-face-to-face time during this encounter.  Signed,   Merri Ray, MD [facility] at [facility].  02/06/19

## 2019-02-06 NOTE — Telephone Encounter (Signed)
Patient called and says her nose is congested, she has polyps in her nose and she can hardly breathe out of it. She says no sinus pain. She says she feels feverish with shivering. She says she has a sore throat, no other symptoms. I asked the patient to hold while I call the office to schedule the appointment, she says she already has an appointment today at 1420, I called the office and spoke to [place], [facility]. Before I could connect the call to [place], [facility], the patient hung up the phone.   Answer Assessment - Initial Assessment Questions 1. LOCATION: "Where does it hurt?"      No pain 2. ONSET: "When did the sinus pain start?"  (e.g., hours, days)      No 3. SEVERITY: "How bad is the pain?"   (Scale 1-10; mild, moderate or severe)   - MILD (1-3): doesn't interfere with normal activities    - MODERATE (4-7): interferes with normal activities (e.g., work or school) or awakens from sleep   - SEVERE (8-10): excruciating pain and patient unable to do any normal activities        No 4. RECURRENT SYMPTOM: "Have you ever had sinus problems before?" If so, ask: "When was the last time?" and "What happened that time?"      Yes 5. NASAL CONGESTION: "Is the nose blocked?" If so, ask, "Can you open it or must you breathe through the mouth?"     Yes 6. NASAL DISCHARGE: "Do you have discharge from your nose?" If so ask, "What color?"     Yellow 7. FEVER: "Do you have a fever?" If so, ask: "What is it, how was it measured, and when did it start?"      Feel slightly feverish, shivering 8. OTHER SYMPTOMS: "Do you have any other symptoms?" (e.g., sore throat, cough, earache, difficulty breathing)     Sore throat 9. PREGNANCY: "Is there any chance you are pregnant?" "When was your last menstrual period?"     No  Protocols used: SINUS PAIN OR CONGESTION-A-AH

## 2019-02-06 NOTE — Progress Notes (Signed)
Chief Complaint: X 4 days stuff runny nose. Pt states inside of her nose inflamed

## 2019-09-25 ENCOUNTER — Ambulatory Visit (HOSPITAL_COMMUNITY)
Admission: EM | Admit: 2019-09-25 | Discharge: 2019-09-25 | Disposition: A | Discharge status: Home / Self Care | Payer: BC Managed Care – PPO | Attending: Family Medicine | Admitting: Family Medicine

## 2019-09-25 ENCOUNTER — Other Ambulatory Visit: Payer: Self-pay

## 2019-09-25 ENCOUNTER — Encounter (HOSPITAL_COMMUNITY): Payer: Self-pay | Admitting: Emergency Medicine

## 2019-09-25 DIAGNOSIS — Z20828 Contact with and (suspected) exposure to other viral communicable diseases: Secondary | ICD-10-CM | POA: Diagnosis present

## 2019-09-25 DIAGNOSIS — U071 COVID-19: Secondary | ICD-10-CM | POA: Diagnosis not present

## 2019-09-25 DIAGNOSIS — Z20822 Contact with and (suspected) exposure to covid-19: Secondary | ICD-10-CM

## 2019-09-25 NOTE — ED Triage Notes (Signed)
Lives with mother who is COVID positive. PT did not feel well Monday, but feels fine today.

## 2019-09-25 NOTE — Discharge Instructions (Addendum)
If your Covid-19 test is positive, you will receive a phone call from [place] regarding your results. Negative test results are not called. Both positive and negative results area always visible on MyChart. If you do not have a MyChart account, sign up instructions are in your discharge papers.  

## 2019-09-26 ENCOUNTER — Telehealth (HOSPITAL_COMMUNITY): Payer: Self-pay | Admitting: Emergency Medicine

## 2019-09-26 LAB — NOVEL CORONAVIRUS, NAA (HOSP ORDER, SEND-OUT TO REF LAB; TAT 18-24 HRS): SARS-CoV-2, NAA: DETECTED — AB

## 2019-09-26 NOTE — Telephone Encounter (Signed)
Your test for COVID-19 was positive, meaning that you were infected with the novel coronavirus and could give the germ to others.  Please continue isolation at home for at least 10 days since the start of your symptoms. If you do not have symptoms, please isolate at home for 10 days from the day you were tested. Once you complete your 10 day quarantine, you may return to normal activities as long as you've not had a fever for over 24 hours(without taking fever reducing medicine) and your symptoms are improving. Please continue good preventive care measures, including:  frequent hand-washing, avoid touching your face, cover coughs/sneezes, stay out of crowds and keep a 6 foot distance from others.  Go to the nearest hospital emergency room if fever/cough/breathlessness are severe or illness seems like a threat to life.  Patient contacted by phone and made aware of    results. Pt verbalized understanding and had all questions answered.  Quarantine ends Jan 10th

## 2019-09-28 NOTE — ED Provider Notes (Signed)
[facility]   BB:[id number] 09/25/19 Arrival Time: [id number]  ASSESSMENT & PLAN:  1. Exposure to COVID-19 virus      COVID-19 testing sent. To self-quarantine until results are available.   Follow-up Information    [facility].   Specialty: Urgent Care Why: As needed. Contact information: [place] [phone]          Reviewed expectations re: course of current medical issues. Questions answered. Outlined signs and symptoms indicating need for more acute intervention. Patient verbalized understanding. After Visit Summary given.   SUBJECTIVE: History from: patient. Amy Santana is a 49 y.o. female who requests COVID-19 testing. Known COVID-19 contact: mother. Recent travel: none. Denies: runny nose, congestion, fever, cough, sore throat, difficulty breathing and headache. Normal PO intake without n/v/d.  ROS: As per HPI.   OBJECTIVE:  Vitals:   09/25/19 1535  BP: 117/78  Pulse: 71  Resp: 16  Temp: 98.6 F (37 C)  TempSrc: Oral  SpO2: 99%    General appearance: alert; no distress Eyes: PERRLA; EOMI; conjunctiva normal HENT: [place]; AT; nasal mucosa normal; oral mucosa normal Neck: supple  Lungs: speaks full sentences without difficulty; unlabored Heart: regular rate and rhythm Abdomen: soft, non-tender Extremities: no edema Skin: warm and dry Neurologic: normal gait Psychological: alert and cooperative; normal mood and affect   No Known Allergies  Past Medical History:  Diagnosis Date  . Uterine fibroid    Social History   Socioeconomic History  . Marital status: Legally Separated    Spouse name: Amy Santana  . Number of children: 3  . Years of education: Not on file  . Highest education level: Bachelor's degree (e.g., BA, AB, BS)  Occupational History  . Occupation: Cytogeneticist    Comment: [employer]  Tobacco Use  . Smoking status:  Never Smoker  . Smokeless tobacco: Never Used  Substance and Sexual Activity  . Alcohol use: No    Alcohol/week: 0.0 standard drinks  . Drug use: No  . Sexual activity: Not on file  Other Topics Concern  . Not on file  Social History Narrative   From [place].   Came to the [place] at age 83.   Lives with her mother and her 3 children.   Separated from her husband.   Social Determinants of Health   Financial Resource Strain:   . Difficulty of Paying Living Expenses: Not on file  Food Insecurity:   . Worried About Charity fundraiser in the Last Year: Not on file  . Ran Out of Food in the Last Year: Not on file  Transportation Needs:   . Lack of Transportation (Medical): Not on file  . Lack of Transportation (Non-Medical): Not on file  Physical Activity:   . Days of Exercise per Week: Not on file  . Minutes of Exercise per Session: Not on file  Stress:   . Feeling of Stress : Not on file  Social Connections:   . Frequency of Communication with Friends and Family: Not on file  . Frequency of Social Gatherings with Friends and Family: Not on file  . Attends Religious Services: Not on file  . Active Member of Clubs or Organizations: Not on file  . Attends Archivist Meetings: Not on file  . Marital Status: Not on file  Intimate Partner Violence:   . Fear of Current or Ex-Partner: Not on file  . Emotionally Abused: Not on file  .  Physically Abused: Not on file  . Sexually Abused: Not on file   Family History  Problem Relation Age of Onset  . Hypertension Mother    Past Surgical History:  Procedure Laterality Date  . NASAL POLYP EXCISION    . WISDOM TOOTH EXTRACTION       Vanessa Kick, MD 09/28/19 [phone]

## 2019-10-08 ENCOUNTER — Ambulatory Visit (HOSPITAL_COMMUNITY)
Admission: EM | Admit: 2019-10-08 | Discharge: 2019-10-08 | Disposition: A | Discharge status: Home / Self Care | Payer: BC Managed Care – PPO | Attending: Family Medicine | Admitting: Family Medicine

## 2019-10-08 ENCOUNTER — Other Ambulatory Visit: Payer: Self-pay

## 2019-10-08 ENCOUNTER — Encounter (HOSPITAL_COMMUNITY): Payer: Self-pay

## 2019-10-08 DIAGNOSIS — Z8616 Personal history of COVID-19: Secondary | ICD-10-CM | POA: Diagnosis not present

## 2019-10-08 DIAGNOSIS — Z0489 Encounter for examination and observation for other specified reasons: Secondary | ICD-10-CM | POA: Diagnosis not present

## 2019-10-08 DIAGNOSIS — U071 COVID-19: Secondary | ICD-10-CM

## 2019-10-08 NOTE — ED Triage Notes (Signed)
Pt presents to [facility] for letter to go back to work after tested positive for COVID on 12/331/2020.

## 2019-10-09 NOTE — ED Provider Notes (Signed)
[facility]   [id number] 10/08/19 Arrival Time: [id number]  ASSESSMENT & PLAN:  1. COVID-19 virus infection      COVID-19 testing sent. See letter/work note on file for self-isolation guidelines.  Follow-up Information    [place].   Specialty: Urgent Care Why: As needed. Contact information: [place] [phone]          Reviewed expectations re: course of current medical issues. Questions answered. Outlined signs and symptoms indicating need for more acute intervention. Patient verbalized understanding. After Visit Summary given.   SUBJECTIVE: History from: patient. Chart review shows she tested + for COVID on 09/15/2019. Feeling completely better now. Needs a note in order to return to work. Denies: runny nose, congestion, fever, cough, sore throat, difficulty breathing and headache. Normal PO intake without n/v/d.  ROS: As per HPI.   OBJECTIVE:  Vitals:   10/08/19 1902  BP: (!) 143/93  Pulse: 81  Resp: 16  Temp: 98.6 F (37 C)  TempSrc: Oral  SpO2: 99%    General appearance: alert; no distress Eyes: PERRLA; EOMI; conjunctiva normal HENT: [place]; AT; nasal mucosa normal; oral mucosa normal Neck: supple  Lungs: speaks full sentences without difficulty; unlabored Extremities: no edema Skin: warm and dry Neurologic: normal gait Psychological: alert and cooperative; normal mood and affect  Labs: Results for orders placed or performed during the hospital encounter of 09/25/19  Novel Coronavirus, NAA (Hosp order, Send-out to Ref Lab; TAT 18-24 hrs   Specimen: Nasopharyngeal Swab; Respiratory  Result Value Ref Range   SARS-CoV-2, NAA DETECTED (A) NOT DETECTED   Coronavirus Source NASOPHARYNGEAL      No Known Allergies  Past Medical History:  Diagnosis Date  . Uterine fibroid    Social History   Socioeconomic History  . Marital status: Legally Separated   Spouse name: Elisabeth Cara Oppong-Agyare  . Number of children: 3  . Years of education: Not on file  . Highest education level: Bachelor's degree (e.g., BA, AB, BS)  Occupational History  . Occupation: Cytogeneticist    Comment: [employer]  Tobacco Use  . Smoking status: Never Smoker  . Smokeless tobacco: Never Used  Substance and Sexual Activity  . Alcohol use: No    Alcohol/week: 0.0 standard drinks  . Drug use: No  . Sexual activity: Not on file  Other Topics Concern  . Not on file  Social History Narrative   From [place].   Came to the [place] at age 40.   Lives with her mother and her 3 children.   Separated from her husband.   Social Determinants of Health   Financial Resource Strain:   . Difficulty of Paying Living Expenses: Not on file  Food Insecurity:   . Worried About Charity fundraiser in the Last Year: Not on file  . Ran Out of Food in the Last Year: Not on file  Transportation Needs:   . Lack of Transportation (Medical): Not on file  . Lack of Transportation (Non-Medical): Not on file  Physical Activity:   . Days of Exercise per Week: Not on file  . Minutes of Exercise per Session: Not on file  Stress:   . Feeling of Stress : Not on file  Social Connections:   . Frequency of Communication with Friends and Family: Not on file  . Frequency of Social Gatherings with Friends and Family: Not on file  . Attends Religious Services: Not on file  .  Active Member of Clubs or Organizations: Not on file  . Attends Archivist Meetings: Not on file  . Marital Status: Not on file  Intimate Partner Violence:   . Fear of Current or Ex-Partner: Not on file  . Emotionally Abused: Not on file  . Physically Abused: Not on file  . Sexually Abused: Not on file   Family History  Problem Relation Age of Onset  . Hypertension Mother    Past Surgical History:  Procedure Laterality Date  . NASAL POLYP EXCISION    . WISDOM TOOTH EXTRACTION       Vanessa Kick,  MD 10/09/19 [phone]

## 2021-03-07 ENCOUNTER — Ambulatory Visit (HOSPITAL_COMMUNITY)
Admission: EM | Admit: 2021-03-07 | Discharge: 2021-03-07 | Disposition: A | Discharge status: Home / Self Care | Payer: BC Managed Care – PPO | Attending: Urgent Care | Admitting: Urgent Care

## 2021-03-07 ENCOUNTER — Encounter (HOSPITAL_COMMUNITY): Payer: Self-pay | Admitting: Emergency Medicine

## 2021-03-07 ENCOUNTER — Other Ambulatory Visit: Payer: Self-pay

## 2021-03-07 DIAGNOSIS — J069 Acute upper respiratory infection, unspecified: Secondary | ICD-10-CM | POA: Diagnosis present

## 2021-03-07 DIAGNOSIS — J309 Allergic rhinitis, unspecified: Secondary | ICD-10-CM

## 2021-03-07 DIAGNOSIS — R0789 Other chest pain: Secondary | ICD-10-CM

## 2021-03-07 DIAGNOSIS — U071 COVID-19: Secondary | ICD-10-CM | POA: Diagnosis not present

## 2021-03-07 MED ORDER — PROMETHAZINE-DM 6.25-15 MG/5ML PO SYRP: 5.0000 mL | ORAL_SOLUTION | Freq: Every evening | ORAL | 0 refills | Status: DC | PRN

## 2021-03-07 MED ORDER — PSEUDOEPHEDRINE HCL 60 MG PO TABS: 60.0000 mg | ORAL_TABLET | Freq: Three times a day (TID) | ORAL | 0 refills | Status: DC | PRN

## 2021-03-07 MED ORDER — FLUTICASONE PROPIONATE 50 MCG/ACT NA SUSP: 1.0000 | Freq: Every day | NASAL | 2 refills | Status: DC

## 2021-03-07 MED ORDER — BENZONATATE 100 MG PO CAPS: 100.0000 mg | ORAL_CAPSULE | Freq: Three times a day (TID) | ORAL | 0 refills | Status: DC | PRN

## 2021-03-07 MED ORDER — CETIRIZINE HCL 10 MG PO TABS: 10.0000 mg | ORAL_TABLET | Freq: Every day | ORAL | 0 refills | Status: DC

## 2021-03-07 NOTE — ED Triage Notes (Signed)
Pt presents with headache, cough, sore throat, and congestion Xs 2 days. States has taken tylenol and honey with no relief.

## 2021-03-07 NOTE — Discharge Instructions (Signed)

## 2021-03-07 NOTE — ED Provider Notes (Signed)
[facility]   MRN: [id number] DOB: 1970-12-02  Subjective:   Amy Santana is a 50 y.o. female presenting for 2-day history of acute onset sinus headache, cough, sore throat, nasal congestion.  Patient had exposure to a patient that tested positive for COVID-19 this past week.  She is COVID vaccinated, has the booster.  Denies history of pulmonary disorders including asthma, COPD.  Patient is not a smoker.  She has tried over-the-counter medications including Tylenol and honey without any relief.  Denies any fevers, body aches, shortness of breath.  No current facility-administered medications for this encounter.  Current Outpatient Medications:    cetirizine (ZYRTEC) 10 MG tablet, Take 1 tablet (10 mg total) by mouth daily. (Patient not taking: Reported on 11/28/2018), Disp: 30 tablet, Rfl: 0   clotrimazole (LOTRIMIN) 1 % cream, Apply 1 application topically 2 (two) times daily. (Patient not taking: Reported on 11/28/2018), Disp: 60 g, Rfl: 1   fluticasone (FLONASE) 50 MCG/ACT nasal spray, Place 1 spray into both nostrils daily., Disp: 16 g, Rfl: 2   levonorgestrel (MIRENA) 20 MCG/24HR IUD, 1 Intra Uterine Device (1 each total) by Intrauterine route once for 1 dose., Disp: 1 each, Rfl: 0   terconazole (TERAZOL 3) 0.8 % vaginal cream, Place 1 applicator vaginally at bedtime. (Patient not taking: Reported on 11/28/2018), Disp: 20 g, Rfl: 2   No Known Allergies  Past Medical History:  Diagnosis Date   Uterine fibroid      Past Surgical History:  Procedure Laterality Date   NASAL POLYP EXCISION     WISDOM TOOTH EXTRACTION      Family History  Problem Relation Age of Onset   Hypertension Mother     Social History   Tobacco Use   Smoking status: Never   Smokeless tobacco: Never  Vaping Use   Vaping Use: Never used  Substance Use Topics   Alcohol use: No    Alcohol/week: 0.0 standard drinks   Drug use: No    ROS   Objective:   Vitals: BP 130/79 (BP  Location: Right Arm)   Pulse 82   Temp 99.2 F (37.3 C) (Oral)   Resp 16   LMP 02/19/2021   SpO2 97%   Physical Exam Constitutional:      General: She is not in acute distress.    Appearance: Normal appearance. She is well-developed. She is not ill-appearing, toxic-appearing or diaphoretic.  HENT:     Head: Normocephalic and atraumatic.     Nose: Nose normal.     Mouth/Throat:     Mouth: Mucous membranes are moist.  Eyes:     Extraocular Movements: Extraocular movements intact.     Pupils: Pupils are equal, round, and reactive to light.  Cardiovascular:     Rate and Rhythm: Normal rate and regular rhythm.     Pulses: Normal pulses.     Heart sounds: Normal heart sounds. No murmur heard.   No friction rub. No gallop.  Pulmonary:     Effort: Pulmonary effort is normal. No respiratory distress.     Breath sounds: Normal breath sounds. No stridor. No wheezing, rhonchi or rales.  Skin:    General: Skin is warm and dry.     Findings: No rash.  Neurological:     Mental Status: She is alert and oriented to person, place, and time.  Psychiatric:        Mood and Affect: Mood normal.        Behavior: Behavior  normal.        Thought Content: Thought content normal.      Assessment and Plan :   PDMP not reviewed this encounter.  1. Viral URI with cough   2. Atypical chest pain   3. Allergic rhinitis, unspecified seasonality, unspecified trigger     Will manage for viral illness such as viral URI, viral syndrome, viral rhinitis, COVID-19. Counseled patient on nature of COVID-19 including modes of transmission, diagnostic testing, management and supportive care.  Offered scripts for symptomatic relief. COVID 19 testing is pending. Counseled patient on potential for adverse effects with medications prescribed/recommended today, ER and return-to-clinic precautions discussed, patient verbalized understanding.     Jaynee Eagles, PA-C 03/07/21 1754

## 2021-03-08 LAB — SARS CORONAVIRUS 2 (TAT 6-24 HRS): SARS Coronavirus 2: POSITIVE — AB

## 2021-11-30 ENCOUNTER — Ambulatory Visit (INDEPENDENT_AMBULATORY_CARE_PROVIDER_SITE_OTHER): Payer: BC Managed Care – PPO | Admitting: Family Medicine

## 2021-11-30 ENCOUNTER — Other Ambulatory Visit: Payer: Self-pay

## 2021-11-30 ENCOUNTER — Encounter: Payer: Self-pay | Admitting: Family Medicine

## 2021-11-30 VITALS — BP 126/80 | HR 82 | Temp 98.5°F | Ht 61.0 in | Wt 175.0 lb

## 2021-11-30 DIAGNOSIS — D259 Leiomyoma of uterus, unspecified: Secondary | ICD-10-CM | POA: Insufficient documentation

## 2021-11-30 DIAGNOSIS — J339 Nasal polyp, unspecified: Secondary | ICD-10-CM | POA: Diagnosis not present

## 2021-11-30 DIAGNOSIS — R7989 Other specified abnormal findings of blood chemistry: Secondary | ICD-10-CM | POA: Diagnosis not present

## 2021-11-30 DIAGNOSIS — J069 Acute upper respiratory infection, unspecified: Secondary | ICD-10-CM | POA: Diagnosis not present

## 2021-11-30 DIAGNOSIS — R7303 Prediabetes: Secondary | ICD-10-CM

## 2021-11-30 NOTE — Progress Notes (Signed)
? ?New Patient Office Visit ? ?Subjective:  ?Patient ID: Amy Santana, female    DOB: September 04, 1971  Age: 51 y.o. MRN: [id number] ? ?CC:  ?Chief Complaint  ?Patient presents with  ? Establish Care  ?  Low TSH level from previous labs  ? ? ?HPI ?Amy Santana presents for new pt. ?F/u TSH. -seen in UC 2/11 for dizziness/congestion ?Cough for 3 days-mucinex/theraflu.  Had fever-but better.  Some pain in chest when coughs. Some congestion and mucus.  Better now. .  Children sick as well.  ?Went to UC 1 wk ago-dizzy when eats.  Did labs and told TSH abn low(0.35)-T4,T# uptake normal. No tremors/palp/diarrhea/sweats   eats a lot of carbs.  ? ?2.  Uterine fibroids-menses regular-heavy bleeding. 6+pads/day for 1-2 days.  Still wants another child. Was seeing gyn in [place] someone local.   ?Taking folic acid- ? ?3.  H/o nasal polyps.  Getting more congestion-saw UC-referred to ENT ? ?Past Medical History:  ?Diagnosis Date  ? Uterine fibroid   ? ? ?Past Surgical History:  ?Procedure Laterality Date  ? NASAL POLYP EXCISION    ? WISDOM TOOTH EXTRACTION    ? ? ?Family History  ?Problem Relation Age of Onset  ? Hypertension Mother   ? ? ?Social History  ? ?Socioeconomic History  ? Marital status: Married  ?  Spouse name: Elisabeth Cara Oppong-Agyare  ? Number of children: 3  ? Years of education: Not on file  ? Highest education level: Bachelor's degree (e.g., BA, AB, BS)  ?Occupational History  ? Occupation: Cytogeneticist  ?  Comment: [employer]  ?Tobacco Use  ? Smoking status: Never  ? Smokeless tobacco: Never  ?Vaping Use  ? Vaping Use: Never used  ?Substance and Sexual Activity  ? Alcohol use: No  ?  Alcohol/week: 0.0 standard drinks  ? Drug use: No  ? Sexual activity: Not Currently  ?  Partners: Male  ?Other Topics Concern  ? Not on file  ?Social History Narrative  ? From [place].  ? Came to the [place] at age 71.  ? Lives with her mother and her 3 children.  ?   ? [place].  ? ?Social Determinants of  Health  ? ?Financial Resource Strain: Not on file  ?Food Insecurity: Not on file  ?Transportation Needs: Not on file  ?Physical Activity: Not on file  ?Stress: Not on file  ?Social Connections: Not on file  ?Intimate Partner Violence: Not on file  ? ? ?ROS  ?No chest pain, palpitations, edema, shortness of breath, nausea, vomiting, diarrhea, constipation.  No depression/anxiety/suicidal ideation ? ?Objective:  ? ?Today's Vitals: BP 126/80   Pulse 82   Temp 98.5 ?F (36.9 ?C) (Temporal)   Ht '5\' 1"'$  (1.549 m)   Wt 175 lb (79.4 kg)   LMP 11/24/2021 (Exact Date)   SpO2 98%   BMI 33.07 kg/m?  ? ?Physical Exam  ?Gen: WDWN NAD OAAF ?HEENT: NCAT, conjunctiva not injected, sclera nonicteric ?TM WNL B, OP moist, no exudates  ?NECK:  supple, no thyromegaly, no nodes, no carotid bruits ?CARDIAC: RRR, S1S2+, no murmur. DP 2+B ?LUNGS: CTAB. No wheezes ?ABDOMEN:  BS+, soft, NTND, No HSM, positive enlarged/fibroid uterus palpable to just under her umbilicus ?EXT:  no edema ?MSK: no gross abnormalities.  ?NEURO: A&O x3.  CN II-XII intact.  ?PSYCH: normal mood. Good eye contact  ? ?Reviewed urgent care's labs from 11/06/2021 ? ?Assessment & Plan:  ? ?Problem List Items Addressed This Visit   ? ?  ?  Respiratory  ? Nasal polyp  ? ?Other Visit Diagnoses   ? ? Abnormal TSH    -  Primary  ? Viral upper respiratory tract infection      ? Uterine leiomyoma, unspecified location      ? ?  ?1.  Abnormal TSH-other thyroid function tests were normal.  Follow-up in 3 months for recheck ?2.  Upper respiratory infection-improving.  Suspect viral.  No antibiotics indicated at this time. ?3.  Uterine fibroids-patient would like to get pregnant.  She would like to get a gynecologist closer to home.  She will research this and let us know.  Advised to use contraception since I do not know the status of the fibroids and if they are too large, they can cause a miscarriage. ?4.  Prediabetes-discussed risk of diabetes.  Work on [employer] ?5.  Nasal  polyps-patient was referred to ENT by urgent care. ? ?Outpatient Encounter Medications as of 11/30/2021  ?Medication Sig  ? Ferrous Sulfate (IRON PO) Take by mouth.  ? [DISCONTINUED] benzonatate (TESSALON) 100 MG capsule Take 1-2 capsules (100-200 mg total) by mouth 3 (three) times daily as needed for cough.  ? [DISCONTINUED] cetirizine (ZYRTEC) 10 MG tablet Take 1 tablet (10 mg total) by mouth daily.  ? [DISCONTINUED] clotrimazole (LOTRIMIN) 1 % cream Apply 1 application topically 2 (two) times daily. (Patient not taking: Reported on 11/28/2018)  ? [DISCONTINUED] fluticasone (FLONASE) 50 MCG/ACT nasal spray Place 1 spray into both nostrils daily.  ? [DISCONTINUED] levonorgestrel (MIRENA) 20 MCG/24HR IUD 1 Intra Uterine Device (1 each total) by Intrauterine route once for 1 dose.  ? [DISCONTINUED] promethazine-dextromethorphan (PROMETHAZINE-DM) 6.25-15 MG/5ML syrup Take 5 mLs by mouth at bedtime as needed for cough.  ? [DISCONTINUED] pseudoephedrine (SUDAFED) 60 MG tablet Take 1 tablet (60 mg total) by mouth every 8 (eight) hours as needed for congestion.  ? [DISCONTINUED] terconazole (TERAZOL 3) 0.8 % vaginal cream Place 1 applicator vaginally at bedtime. (Patient not taking: Reported on 11/28/2018)  ? ?No facility-administered encounter medications on file as of 11/30/2021.  ? ? ?Follow-up: Return in about 3 months (around 03/02/2022) for thyroid, sugars-physical.  ? ?Wellington Hampshire, MD ?

## 2021-11-30 NOTE — Patient Instructions (Signed)
Welcome to [facility]! It was a pleasure meeting you today. ? ?As discussed, Please schedule a  month follow up visit today. ? ?Dr. Acquanetta Belling. ? ?PLEASE NOTE: ? ?If you had any LAB tests please let us know if you have not heard back within a few days. You may see your results on MyChart before we have a chance to review them but we will give you a call once they are reviewed by [place]. If we ordered any REFERRALS today, please let us know if you have not heard from their office within the next week.  ?Let us know through MyChart if you are needing REFILLS, or have your pharmacy send [place] the request. You can also use MyChart to communicate with me or any office staff. ? ?Please try these tips to maintain a healthy lifestyle: ? ?Eat most of your calories during the day when you are active. Eliminate processed foods including packaged sweets (pies, cakes, cookies), reduce intake of potatoes, white bread, white pasta, and white rice. Look for whole grain options, oat flour or almond flour. ? ?Each meal should contain half fruits/vegetables, one quarter protein, and one quarter carbs (no bigger than a computer mouse). ? ?Cut down on sweet beverages. This includes juice, soda, and sweet tea. Also watch fruit intake, though this is a healthier sweet option, it still contains natural sugar! Limit to 3 servings daily. ? ?Drink at least 1 glass of water with each meal and aim for at least 8 glasses per day ? ?Exercise at least 150 minutes every week.   ?

## 2022-03-02 ENCOUNTER — Encounter: Payer: BC Managed Care – PPO | Admitting: Family Medicine

## 2022-03-30 ENCOUNTER — Ambulatory Visit (INDEPENDENT_AMBULATORY_CARE_PROVIDER_SITE_OTHER): Payer: BC Managed Care – PPO | Admitting: Family Medicine

## 2022-03-30 ENCOUNTER — Encounter: Payer: Self-pay | Admitting: Family Medicine

## 2022-03-30 VITALS — BP 122/78 | HR 75 | Temp 98.1°F | Ht 61.0 in | Wt 175.2 lb

## 2022-03-30 DIAGNOSIS — Z1159 Encounter for screening for other viral diseases: Secondary | ICD-10-CM

## 2022-03-30 DIAGNOSIS — Z0000 Encounter for general adult medical examination without abnormal findings: Secondary | ICD-10-CM

## 2022-03-30 DIAGNOSIS — Z1211 Encounter for screening for malignant neoplasm of colon: Secondary | ICD-10-CM

## 2022-03-30 DIAGNOSIS — R7303 Prediabetes: Secondary | ICD-10-CM

## 2022-03-30 DIAGNOSIS — R7989 Other specified abnormal findings of blood chemistry: Secondary | ICD-10-CM | POA: Diagnosis not present

## 2022-03-30 DIAGNOSIS — E876 Hypokalemia: Secondary | ICD-10-CM

## 2022-03-30 LAB — CBC WITH DIFFERENTIAL/PLATELET
Basophils Absolute: 0 10*3/uL (ref 0.0–0.1)
Basophils Relative: 0.5 % (ref 0.0–3.0)
Eosinophils Absolute: 0.3 10*3/uL (ref 0.0–0.7)
Eosinophils Relative: 8.2 % — ABNORMAL HIGH (ref 0.0–5.0)
HCT: 37.2 % (ref 36.0–46.0)
Hemoglobin: 12.3 g/dL (ref 12.0–15.0)
Lymphocytes Relative: 27.2 % (ref 12.0–46.0)
Lymphs Abs: 0.9 10*3/uL (ref 0.7–4.0)
MCHC: 33.1 g/dL (ref 30.0–36.0)
MCV: 91 fl (ref 78.0–100.0)
Monocytes Absolute: 0.3 10*3/uL (ref 0.1–1.0)
Monocytes Relative: 7.7 % (ref 3.0–12.0)
Neutro Abs: 1.9 10*3/uL (ref 1.4–7.7)
Neutrophils Relative %: 56.4 % (ref 43.0–77.0)
Platelets: 191 10*3/uL (ref 150.0–400.0)
RBC: 4.08 Mil/uL (ref 3.87–5.11)
RDW: 15.2 % (ref 11.5–15.5)
WBC: 3.4 10*3/uL — ABNORMAL LOW (ref 4.0–10.5)

## 2022-03-30 LAB — COMPREHENSIVE METABOLIC PANEL
ALT: 23 U/L (ref 0–35)
AST: 24 U/L (ref 0–37)
Albumin: 4.2 g/dL (ref 3.5–5.2)
Alkaline Phosphatase: 51 U/L (ref 39–117)
BUN: 10 mg/dL (ref 6–23)
CO2: 27 mEq/L (ref 19–32)
Calcium: 8.8 mg/dL (ref 8.4–10.5)
Chloride: 104 mEq/L (ref 96–112)
Creatinine, Ser: 0.74 mg/dL (ref 0.40–1.20)
GFR: 94.55 mL/min (ref >60.00)
Glucose, Bld: 92 mg/dL (ref 70–99)
Potassium: 3.9 mEq/L (ref 3.5–5.1)
Sodium: 141 mEq/L (ref 135–145)
Total Bilirubin: 0.4 mg/dL (ref 0.2–1.2)
Total Protein: 6.8 g/dL (ref 6.0–8.3)

## 2022-03-30 LAB — LIPID PANEL
Cholesterol: 220 mg/dL — ABNORMAL HIGH (ref 0–200)
HDL: 57.6 mg/dL (ref >39.00)
LDL Cholesterol: 147 mg/dL — ABNORMAL HIGH (ref 0–99)
NonHDL: 162.42
Total CHOL/HDL Ratio: 4
Triglycerides: 79 mg/dL (ref 0.0–149.0)
VLDL: 15.8 mg/dL (ref 0.0–40.0)

## 2022-03-30 LAB — T3, FREE: T3, Free: 2.9 pg/mL (ref 2.3–4.2)

## 2022-03-30 LAB — T4, FREE: Free T4: 0.7 ng/dL (ref 0.60–1.60)

## 2022-03-30 LAB — HEMOGLOBIN A1C: Hgb A1c MFr Bld: 5.7 % (ref 4.6–6.5)

## 2022-03-30 LAB — TSH: TSH: 2.14 u[IU]/mL (ref 0.35–5.50)

## 2022-03-30 NOTE — Patient Instructions (Signed)
It was very nice to see you today!  Good luck with fibroid   PLEASE NOTE:  If you had any lab tests please let us know if you have not heard back within a few days. You may see your results on MyChart before we have a chance to review them but we will give you a call once they are reviewed by [place]. If we ordered any referrals today, please let us know if you have not heard from their office within the next week.   Please try these tips to maintain a healthy lifestyle:  Eat most of your calories during the day when you are active. Eliminate processed foods including packaged sweets (pies, cakes, cookies), reduce intake of potatoes, white bread, white pasta, and white rice. Look for whole grain options, oat flour or almond flour.  Each meal should contain half fruits/vegetables, one quarter protein, and one quarter carbs (no bigger than a computer mouse).  Cut down on sweet beverages. This includes juice, soda, and sweet tea. Also watch fruit intake, though this is a healthier sweet option, it still contains natural sugar! Limit to 3 servings daily.  Drink at least 1 glass of water with each meal and aim for at least 8 glasses per day  Exercise at least 150 minutes every week.

## 2022-03-30 NOTE — Progress Notes (Signed)
Phone [phone]   Subjective:   Patient is a 51 y.o. female presenting for annual physical.    Chief Complaint  Patient presents with   Annual Exam    Fasting for labs     Wellness-had pap/mamm w/Dr. Meissenger-sees this wk to discuss fibroid removal.   See problem oriented charting- ROS- ROS: Gen: no fever, chills  Skin: no rash, itching ENT: no ear pain, ear drainage, nasal congestion, rhinorrhea, sinus pressure, sore throat Eyes: no blurry vision, double vision Resp: no cough, wheeze,SOB CV: no CP, palpitations, LE edema,  GI: no heartburn, n/v/d/c, abd pain GU: no dysuria, urgency, frequency, hematuria MSK: no joint pain, myalgias, back pain Neuro: no dizziness, headache, weakness, vertigo.  Occ HA if not eating.  Psych: no depression, anxiety, insomnia, SI   The following were reviewed and entered/updated in epic: Past Medical History:  Diagnosis Date   Uterine fibroid    Patient Active Problem List   Diagnosis Date Noted   Prediabetes 11/30/2021   Uterine leiomyoma 11/30/2021   Seasonal allergic rhinitis 01/17/2013   GERD without esophagitis 01/17/2013   Nasal polyp 01/04/2013   Past Surgical History:  Procedure Laterality Date   NASAL POLYP EXCISION     WISDOM TOOTH EXTRACTION      Family History  Problem Relation Age of Onset   Hypertension Mother     Medications- reviewed and updated Current Outpatient Medications  Medication Sig Dispense Refill   Ferrous Sulfate (IRON PO) Take by mouth.     FOLIC ACID PO Take 1 tablet by mouth daily.     No current facility-administered medications for this visit.    Allergies-reviewed and updated No Known Allergies  Social History   Social History Narrative   From [place].   Came to the [place] at age 16.   Lives with her mother and her 3 children.      [place].   Objective  Objective:  BP 122/78   Pulse 75   Temp 98.1 F (36.7 C) (Temporal)   Ht $R'5\' 1"'pw$  (1.549 m)   Wt 175 lb 4 oz  (79.5 kg)   LMP 03/28/2022   SpO2 99%   BMI 33.11 kg/m  Physical Exam Constitutional:      Appearance: She is obese.  HENT:     Head: Normocephalic and atraumatic.     Right Ear: Tympanic membrane, ear canal and external ear normal.     Left Ear: Tympanic membrane, ear canal and external ear normal.     Nose: Nose normal.     Mouth/Throat:     Mouth: Mucous membranes are moist.     Pharynx: Oropharynx is clear.  Eyes:     General: No scleral icterus.    Extraocular Movements: Extraocular movements intact.     Conjunctiva/sclera: Conjunctivae normal.     Pupils: Pupils are equal, round, and reactive to light.  Neck:     Vascular: No carotid bruit.  Cardiovascular:     Rate and Rhythm: Normal rate and regular rhythm.     Pulses: Normal pulses.     Heart sounds: Normal heart sounds. No murmur heard. Pulmonary:     Effort: Pulmonary effort is normal.     Breath sounds: Normal breath sounds.  Abdominal:     General: Bowel sounds are normal. There is no distension.     Palpations: Abdomen is soft. There is mass.     Tenderness: There is no abdominal tenderness.       Comments: Enlarged  uterus  Musculoskeletal:     Cervical back: Neck supple.     Right lower leg: No edema.     Left lower leg: No edema.  Lymphadenopathy:     Cervical: No cervical adenopathy.  Neurological:     General: No focal deficit present.     Mental Status: She is alert and oriented to person, place, and time.  Psychiatric:        Mood and Affect: Mood normal.        Assessment and Plan   Health Maintenance counseling: 1. Anticipatory guidance: Patient counseled regarding regular dental exams q6 months, eye exams,  avoiding smoking and second hand smoke, limiting alcohol to 1 beverage per day, no illicit drugs.   2. Risk factor reduction:  Advised patient of need for regular exercise and diet rich and fruits and vegetables to reduce risk of heart attack and stroke. Exercise- encouraged.  Wt  Readings from Last 3 Encounters:  03/30/22 175 lb 4 oz (79.5 kg)  11/30/21 175 lb (79.4 kg)  02/06/19 169 lb (76.7 kg)   3. Immunizations/screenings/ancillary studies Immunization History  Administered Date(s) Administered   Influenza,inj,Quad PF,6+ Mos 07/10/2013   Influenza-Unspecified 06/26/2015, 06/26/2017   PFIZER(Purple Top)SARS-COV-2 Vaccination 12/06/2019, 12/27/2019, 09/01/2020   Tdap 07/10/2013   Health Maintenance Due  Topic Date Due   Hepatitis C Screening  Never done   COLONOSCOPY (Pts 45-68yr Insurance coverage will need to be confirmed)  Never done   PAP SMEAR-Modifier  02/07/2021   MAMMOGRAM  02/22/2022    4. Cervical cancer screening- defer gyn 5. Breast cancer screening-  mammogram gyn 6. Colon cancer screening - declined scope willing to do the kit 7. Skin cancer screening- advised regular sunscreen use. Denies worrisome, changing, or new skin lesions.  8. Birth control/STD check- none-wants to get pregnant 9. Osteoporosis screening- n/a 10. Smoking associated screening - non smoker  Problem List Items Addressed This Visit       Other   Prediabetes   Relevant Orders   Hemoglobin A1c   Other Visit Diagnoses     Wellness examination    -  Primary   Relevant Orders   Hemoglobin A1c   Lipid panel   TSH   Comprehensive metabolic panel   CBC with Differential/Platelet   Abnormal TSH       Relevant Orders   TSH   T3, free   T4, free   Thyroid peroxidase antibody   Encounter for hepatitis C screening test for low risk patient       Relevant Orders   Hepatitis C antibody   Screen for colon cancer       Relevant Orders   Cologuard     1   Wellness-antic guidance.  Work on [employer]  Check cbc,cmp,tsh,lipids,a1c. Sch colon.  Pt had gyn mamm/pap-normal per pt.  Declines shingrix.  Declines cscope-willing to do cologard 2.  Abn TSH-was thought to be viral possibly.  Check TSH,FT4,FT3,TPO.  No symptoms 3.  preDM-work on TLC.  Check A1C    Recommended  follow up: annualReturn in about 1 year (around 03/31/2023) for annual. No future appointments.  Lab/Order associations:+ fasting   ICD-10-CM   1. Wellness examination  Z00.00 Hemoglobin A1c    Lipid panel    TSH    Comprehensive metabolic panel    CBC with Differential/Platelet    2. Prediabetes  R73.03 Hemoglobin A1c    3. Abnormal TSH  R79.89 TSH    T3, free  T4, free    Thyroid peroxidase antibody    4. Encounter for hepatitis C screening test for low risk patient  Z11.59 Hepatitis C antibody    5. Screen for colon cancer  Z12.11 Cologuard      No orders of the defined types were placed in this encounter.   Wellington Hampshire, MD

## 2022-03-31 LAB — HEPATITIS C ANTIBODY: Hepatitis C Ab: NONREACTIVE

## 2022-03-31 LAB — THYROID PEROXIDASE ANTIBODY: Thyroperoxidase Ab SerPl-aCnc: <1 IU/mL (ref <9)

## 2022-04-01 ENCOUNTER — Other Ambulatory Visit: Payer: Self-pay | Admitting: *Deleted

## 2022-04-01 DIAGNOSIS — D72819 Decreased white blood cell count, unspecified: Secondary | ICD-10-CM

## 2022-04-07 ENCOUNTER — Emergency Department (HOSPITAL_COMMUNITY)
Admission: EM | Admit: 2022-04-07 | Discharge: 2022-04-08 | Disposition: A | Discharge status: Home / Self Care | Payer: BC Managed Care – PPO | Attending: Emergency Medicine | Admitting: Emergency Medicine

## 2022-04-07 ENCOUNTER — Encounter (HOSPITAL_COMMUNITY): Payer: Self-pay

## 2022-04-07 DIAGNOSIS — R1084 Generalized abdominal pain: Secondary | ICD-10-CM | POA: Diagnosis not present

## 2022-04-07 DIAGNOSIS — R1013 Epigastric pain: Secondary | ICD-10-CM | POA: Diagnosis present

## 2022-04-07 DIAGNOSIS — R739 Hyperglycemia, unspecified: Secondary | ICD-10-CM | POA: Insufficient documentation

## 2022-04-07 DIAGNOSIS — R824 Acetonuria: Secondary | ICD-10-CM | POA: Diagnosis not present

## 2022-04-07 LAB — COMPREHENSIVE METABOLIC PANEL
ALT: 26 U/L (ref 0–44)
AST: 26 U/L (ref 15–41)
Albumin: 3.9 g/dL (ref 3.5–5.0)
Alkaline Phosphatase: 59 U/L (ref 38–126)
Anion gap: 12 (ref 5–15)
BUN: 8 mg/dL (ref 6–20)
CO2: 25 mmol/L (ref 22–32)
Calcium: 9.5 mg/dL (ref 8.9–10.3)
Chloride: 101 mmol/L (ref 98–111)
Creatinine, Ser: 0.77 mg/dL (ref 0.44–1.00)
GFR, Estimated: >60 mL/min (ref >60)
Glucose, Bld: 128 mg/dL — ABNORMAL HIGH (ref 70–99)
Potassium: 3.2 mmol/L — ABNORMAL LOW (ref 3.5–5.1)
Sodium: 138 mmol/L (ref 135–145)
Total Bilirubin: 0.5 mg/dL (ref 0.3–1.2)
Total Protein: 7.5 g/dL (ref 6.5–8.1)

## 2022-04-07 LAB — URINALYSIS, ROUTINE W REFLEX MICROSCOPIC
Bilirubin Urine: NEGATIVE
Glucose, UA: NEGATIVE mg/dL
Ketones, ur: 5 mg/dL — AB
Leukocytes,Ua: NEGATIVE
Nitrite: NEGATIVE
Protein, ur: 30 mg/dL — AB
Specific Gravity, Urine: 1.017 (ref 1.005–1.030)
pH: 6 (ref 5.0–8.0)

## 2022-04-07 LAB — CBC WITH DIFFERENTIAL/PLATELET
Abs Immature Granulocytes: 0.02 10*3/uL (ref 0.00–0.07)
Basophils Absolute: 0 10*3/uL (ref 0.0–0.1)
Basophils Relative: 0 %
Eosinophils Absolute: 0.2 10*3/uL (ref 0.0–0.5)
Eosinophils Relative: 4 %
HCT: 38.3 % (ref 36.0–46.0)
Hemoglobin: 12.7 g/dL (ref 12.0–15.0)
Immature Granulocytes: 0 %
Lymphocytes Relative: 17 %
Lymphs Abs: 1.2 10*3/uL (ref 0.7–4.0)
MCH: 30 pg (ref 26.0–34.0)
MCHC: 33.2 g/dL (ref 30.0–36.0)
MCV: 90.3 fL (ref 80.0–100.0)
Monocytes Absolute: 0.3 10*3/uL (ref 0.1–1.0)
Monocytes Relative: 5 %
Neutro Abs: 5 10*3/uL (ref 1.7–7.7)
Neutrophils Relative %: 74 %
Platelets: 230 10*3/uL (ref 150–400)
RBC: 4.24 MIL/uL (ref 3.87–5.11)
RDW: 13.2 % (ref 11.5–15.5)
WBC: 6.7 10*3/uL (ref 4.0–10.5)
nRBC: 0 % (ref 0.0–0.2)

## 2022-04-07 LAB — I-STAT BETA HCG BLOOD, ED (MC, WL, AP ONLY): I-stat hCG, quantitative: <5 m[IU]/mL (ref <5)

## 2022-04-07 LAB — LIPASE, BLOOD: Lipase: 35 U/L (ref 11–51)

## 2022-04-07 NOTE — ED Provider Triage Note (Signed)
Emergency Medicine Provider Triage Evaluation Note  Amy Santana , a 51 y.o. female  was evaluated in triage.  Pt complains of diffuse abdominal pain onset today.  Has a history of fibroids.  Has associated nausea, vomiting.  Denies fever.  No meds tried prior to arrival.  Still has her gallbladder and appendix.  Per patient chart review: Patient was evaluated by Dr. Willis Modena for uterine fibroid removal on 03/31/2022.  Review of Systems  Positive: Work-up initiated Negative:   Physical Exam  BP 116/71   Pulse 77   Temp 98.2 F (36.8 C) (Oral)   Resp 18   LMP 03/28/2022   SpO2 98%  Gen:   Awake, no distress   Resp:  Normal effort  MSK:   Moves extremities without difficulty  Other:  Diffuse abdominal tenderness to palpation.  Medical Decision Making  Medically screening exam initiated at 9:34 PM.  Appropriate orders placed.  Amy Santana was informed that the remainder of the evaluation will be completed by another provider, this initial triage assessment does not replace that evaluation, and the importance of remaining in the ED until their evaluation is complete.  Work-up initiated   Tereka Thorley A, PA-C 04/07/22 2136

## 2022-04-07 NOTE — ED Triage Notes (Signed)
Pt states that she has been having generalized abd pain that has been going on all day with no n/v/d, hx of fibroids.

## 2022-04-08 ENCOUNTER — Other Ambulatory Visit: Payer: Self-pay

## 2022-04-08 MED ORDER — FAMOTIDINE 20 MG PO TABS: 20.0000 mg | ORAL_TABLET | Freq: Two times a day (BID) | ORAL | 0 refills | Status: DC

## 2022-04-08 MED ORDER — SUCRALFATE 1 G PO TABS: 1.0000 g | ORAL_TABLET | Freq: Three times a day (TID) | ORAL | 0 refills | Status: DC

## 2022-04-08 NOTE — ED Provider Notes (Signed)
[facility] EMERGENCY DEPARTMENT Provider Note   CSN: [id number] Arrival date & time: 04/07/22  2118     History  Chief Complaint  Patient presents with   Abdominal Pain    Amy Santana is a 51 y.o. female.  HPI Patient presents with epigastric pain.  Pain began about 12 hours ago, lasted about 90 minutes and has resolved.  She is currently asymptomatic.  She denies history of cardiac disease, does not smoke, does not drink.  No change in medication, diet, activity.  She takes no medication regularly.  She notes a history of fibroids, states that this may have been the culprit, though she denies prior episodes of pain associated with fibroids.  She also offers that she may have eaten something different from normal.    Home Medications Prior to Admission medications   Medication Sig Start Date End Date Taking? Authorizing Provider  famotidine (PEPCID) 20 MG tablet Take 1 tablet (20 mg total) by mouth 2 (two) times daily. 04/08/22  Yes Carmin Muskrat, MD  sucralfate (CARAFATE) 1 g tablet Take 1 tablet (1 g total) by mouth 4 (four) times daily -  with meals and at bedtime. 04/08/22  Yes Carmin Muskrat, MD  Ferrous Sulfate (IRON PO) Take by mouth.    [provider]  FOLIC ACID PO Take 1 tablet by mouth daily.    [provider]      Allergies    Patient has no known allergies.    Review of Systems   Review of Systems  All other systems reviewed and are negative.   Physical Exam Updated Vital Signs BP 133/88 (BP Location: Right Arm)   Pulse 82   Temp 98.3 F (36.8 C) (Oral)   Resp 16   Ht '5\' 1"'$  (1.549 m)   Wt 79.4 kg   LMP 03/28/2022   SpO2 100%   BMI 33.07 kg/m  Physical Exam Vitals and nursing note reviewed.  Constitutional:      General: She is not in acute distress.    Appearance: She is well-developed.  HENT:     Head: Normocephalic and atraumatic.  Eyes:     Conjunctiva/sclera: Conjunctivae normal.   Cardiovascular:     Rate and Rhythm: Normal rate and regular rhythm.  Pulmonary:     Effort: Pulmonary effort is normal. No respiratory distress.     Breath sounds: Normal breath sounds. No stridor.  Abdominal:     General: There is no distension.     Tenderness: There is no abdominal tenderness. There is no guarding. Negative signs include Murphy's sign and McBurney's sign.  Skin:    General: Skin is warm and dry.  Neurological:     Mental Status: She is alert and oriented to person, place, and time.     Cranial Nerves: No cranial nerve deficit.  Psychiatric:        Mood and Affect: Mood normal.     ED Results / Procedures / Treatments   Labs (all labs ordered are listed, but only abnormal results are displayed) Labs Reviewed  COMPREHENSIVE METABOLIC PANEL - Abnormal; Notable for the following components:      Result Value   Potassium 3.2 (*)    Glucose, Bld 128 (*)    All other components within normal limits  URINALYSIS, ROUTINE W REFLEX MICROSCOPIC - Abnormal; Notable for the following components:   Color, Urine AMBER (*)    APPearance HAZY (*)    Hgb urine dipstick SMALL (*)  Ketones, ur 5 (*)    Protein, ur 30 (*)    Bacteria, UA MANY (*)    All other components within normal limits  CBC WITH DIFFERENTIAL/PLATELET  LIPASE, BLOOD  I-STAT BETA HCG BLOOD, ED ([facility], [facility], [facility] ONLY)   Procedures Procedures    Medications Ordered in ED Medications - No data to display  ED Course/ Medical Decision Making/ A&P This patient with a Hx of uterine fibroids presents to the ED for concern of abdominal pain, resolved, this involves an extensive number of treatment options, and is a complaint that carries with it a high risk of complications and morbidity.    The differential diagnosis includes hepatobiliary dysfunction, esophageal abnormality, gastritis, diverticulitis, less likely ACS   Social Determinants of Health:  No limiting factors  Additional history  obtained:  Additional history and/or information obtained from mother at bedside, notable for contribution to the HPI included above   After the initial evaluation, orders, including: Labs ordered from triage.   Patient placed on Cardiac and Pulse-Oximetry Monitors. The patient was maintained on a cardiac monitor.  The cardiac monitored showed an rhythm of 80 sinus normal The patient was also maintained on pulse oximetry. The readings were typically 100% room air normal   On repeat evaluation of the patient stayed the same Patient asymptomatic throughout my evaluation Lab Tests:  I personally interpreted labs.  The pertinent results include: 2 normal troponin, mild hyperglycemia, mild ketone urea   Dispostion / Final MDM:  After consideration of the diagnostic results and the patient's response to treatment, we discussed results at length, including normal troponin, mild hyperglycemia, normal lipase.  Minimal suspicion for atypical ACS with normal troponin, and with resolution of symptoms, this seems less likely.  Some suspicion for gastroesophageal etiology given the burning sensation in the epigastrium.  With mild ketone urea we discussed options for therapy including IV fluids, PPI here versus discharge with GI follow-up.  She notes that she was previously been referred to GI but has not yet executed that.  With no hemodynamic instability, no symptoms currently, reassuring labs and general, patient discharged in stable condition to follow-up with primary care.   Final Clinical Impression(s) / ED Diagnoses Final diagnoses:  Generalized abdominal pain    Rx / DC Orders ED Discharge Orders          Ordered    famotidine (PEPCID) 20 MG tablet  2 times daily        04/08/22 0748    sucralfate (CARAFATE) 1 g tablet  3 times daily with meals & bedtime       Note to Pharmacy: Take for one week   04/08/22 0748              Carmin Muskrat, MD 04/08/22 [id number]

## 2022-04-08 NOTE — Discharge Instructions (Signed)
As discussed, your evaluation today has been largely reassuring.  But, it is important that you monitor your condition carefully, and do not hesitate to return to the ED if you develop new, or concerning changes in your condition. ? ?Otherwise, please follow-up with your physician for appropriate ongoing care. ? ?

## 2022-05-17 ENCOUNTER — Other Ambulatory Visit (INDEPENDENT_AMBULATORY_CARE_PROVIDER_SITE_OTHER): Payer: BC Managed Care – PPO

## 2022-05-17 DIAGNOSIS — E876 Hypokalemia: Secondary | ICD-10-CM

## 2022-05-17 LAB — BASIC METABOLIC PANEL
BUN: 8 mg/dL (ref 6–23)
CO2: 24 mEq/L (ref 19–32)
Calcium: 9 mg/dL (ref 8.4–10.5)
Chloride: 104 mEq/L (ref 96–112)
Creatinine, Ser: 0.78 mg/dL (ref 0.40–1.20)
GFR: 88.68 mL/min (ref >60.00)
Glucose, Bld: 130 mg/dL — ABNORMAL HIGH (ref 70–99)
Potassium: 3.4 mEq/L — ABNORMAL LOW (ref 3.5–5.1)
Sodium: 137 mEq/L (ref 135–145)

## 2022-05-17 NOTE — Addendum Note (Signed)
Addended by: Wellington Hampshire on: 05/17/2022 02:22 PM   Modules accepted: Orders

## 2022-05-18 ENCOUNTER — Telehealth: Payer: Self-pay | Admitting: Family Medicine

## 2022-05-18 NOTE — Telephone Encounter (Signed)
Spoke with patient, gave lab results and recommendations.

## 2022-05-18 NOTE — Telephone Encounter (Signed)
Patient returned call. Patient requests to be called at ph# [phone].

## 2022-10-17 ENCOUNTER — Telehealth: Payer: Self-pay | Admitting: Family Medicine

## 2022-10-17 ENCOUNTER — Emergency Department (HOSPITAL_BASED_OUTPATIENT_CLINIC_OR_DEPARTMENT_OTHER)
Admission: EM | Admit: 2022-10-17 | Discharge: 2022-10-17 | Disposition: A | Discharge status: Home / Self Care | Payer: BC Managed Care – PPO | Attending: Emergency Medicine | Admitting: Emergency Medicine

## 2022-10-17 ENCOUNTER — Other Ambulatory Visit: Payer: Self-pay

## 2022-10-17 ENCOUNTER — Emergency Department (HOSPITAL_BASED_OUTPATIENT_CLINIC_OR_DEPARTMENT_OTHER): Payer: BC Managed Care – PPO | Admitting: Radiology

## 2022-10-17 DIAGNOSIS — M722 Plantar fascial fibromatosis: Secondary | ICD-10-CM | POA: Diagnosis not present

## 2022-10-17 DIAGNOSIS — M79671 Pain in right foot: Secondary | ICD-10-CM | POA: Diagnosis present

## 2022-10-17 MED ORDER — DICLOFENAC SODIUM 75 MG PO TBEC: 75.0000 mg | DELAYED_RELEASE_TABLET | Freq: Two times a day (BID) | ORAL | 0 refills | Status: DC

## 2022-10-17 NOTE — ED Provider Notes (Signed)
[name] Provider Note   CSN: [id number] Arrival date & time: 10/17/22  1338     History  Chief Complaint  Patient presents with   Ankle Pain    Right    Amy Santana is a 52 y.o. female.  Pt complains of pain in her foot.  Pt has had similar in the past and had orthotics made for her shoes.  Pt reports foot is swollen and she has pain with walking.    The history is provided by the patient. No language interpreter was used.  Ankle Pain Location:  Ankle and foot Time since incident:  4 days Injury: no   Ankle location:  R ankle Foot location:  R foot Pain details:    Quality:  Aching   Radiates to:  Does not radiate   Severity:  Moderate   Onset quality:  Gradual   Duration:  4 days   Timing:  Constant Chronicity:  New Dislocation: no   Foreign body present:  No foreign bodies Prior injury to area:  No Relieved by:  Nothing Worsened by:  Nothing Ineffective treatments:  None tried Risk factors: no concern for non-accidental trauma        Home Medications Prior to Admission medications   Medication Sig Start Date End Date Taking? Authorizing Provider  diclofenac (VOLTAREN) 75 MG EC tablet Take 1 tablet (75 mg total) by mouth 2 (two) times daily. 10/17/22  Yes Caryl Ada K, PA-C  famotidine (PEPCID) 20 MG tablet Take 1 tablet (20 mg total) by mouth 2 (two) times daily. 04/08/22   Carmin Muskrat, MD  Ferrous Sulfate (IRON PO) Take by mouth.    [provider]  FOLIC ACID PO Take 1 tablet by mouth daily.    [provider]  sucralfate (CARAFATE) 1 g tablet Take 1 tablet (1 g total) by mouth 4 (four) times daily -  with meals and at bedtime. 04/08/22   Carmin Muskrat, MD      Allergies    Patient has no known allergies.    Review of Systems   Review of Systems  All other systems reviewed and are negative.   Physical Exam Updated Vital Signs BP (!) 134/93 (BP Location: Right Arm)    Pulse 86   Temp 98.6 F (37 C) (Oral)   Resp 16   Ht '5\' 1"'$  (1.549 m)   Wt 74.8 kg   SpO2 100%   BMI 31.18 kg/m  Physical Exam Vitals reviewed.  Constitutional:      Appearance: Normal appearance.  Cardiovascular:     Rate and Rhythm: Normal rate.  Pulmonary:     Effort: Pulmonary effort is normal.  Musculoskeletal:        General: Swelling and tenderness present.     Comments: Sweollen tender foot,  tender swollen heel  nv and ns intact   Skin:    General: Skin is warm.  Neurological:     General: No focal deficit present.     Mental Status: She is alert.  Psychiatric:        Mood and Affect: Mood normal.     ED Results / Procedures / Treatments   Labs (all labs ordered are listed, but only abnormal results are displayed) Labs Reviewed - No data to display  EKG None  Radiology DG Foot Complete Right  Result Date: 10/17/2022 CLINICAL DATA:  Pain EXAM: RIGHT FOOT COMPLETE - 3 VIEW COMPARISON:  None Available. FINDINGS: There is  no evidence of fracture or dislocation. There is no evidence of arthropathy or other focal bone abnormality. Soft tissues are unremarkable. Small posterior calcaneal spur. IMPRESSION: Posterior calcaneal spur.  No acute osseous abnormalities. Electronically Signed   By: Sammie Bench M.D.   On: 10/17/2022 15:08   DG Ankle Complete Right  Result Date: 10/17/2022 CLINICAL DATA:  Worsening pain.  No injury. EXAM: RIGHT ANKLE - COMPLETE 3 VIEW COMPARISON:  None Available. FINDINGS: There is no evidence of fracture, dislocation, or joint effusion. There is no evidence of arthropathy or other focal bone abnormality. Soft tissues are unremarkable. Mild enthesopathic changes of the calcaneus. IMPRESSION: 1. No acute displaced fracture or dislocation. 2. Mild enthesopathic changes at the calcaneus. Electronically Signed   By: Marin Roberts M.D.   On: 10/17/2022 15:01    Procedures Procedures    Medications Ordered in ED Medications - No data to  display  ED Course/ Medical Decision Making/ A&P                             Medical Decision Making Pt complains of foot pain.  Pt has a history of plantar fascitis in the past  Amount and/or Complexity of Data Reviewed Radiology: ordered and independent interpretation performed. Decision-making details documented in ED Course.    Details: Xray shows spur to calcaneous   Risk Prescription drug management. Risk Details: Pt counseled on xrays.  Pt given rx for voltaren, ace wrap and post op shoe Pt referred to [facility]            Final Clinical Impression(s) / ED Diagnoses Final diagnoses:  Plantar fasciitis  Foot pain, right    Rx / DC Orders ED Discharge Orders          Ordered    diclofenac (VOLTAREN) 75 MG EC tablet  2 times daily        10/17/22 1612           An After Visit Summary was printed and given to the patient.    Fransico Meadow, [place] 10/17/22 1652    Regan Lemming, MD 10/18/22 1226

## 2022-10-17 NOTE — Telephone Encounter (Signed)
Patient states: - Leg pain since yesterday; calf hurts but ankle is swollen  - Rates pain an 9/10  - Hard to walk on leg   Patient has been transferred to triage.

## 2022-10-17 NOTE — ED Notes (Signed)
RN provided AVS using Teachback Method. Patient verbalizes understanding of Discharge Instructions. Opportunity for Questioning and Answers were provided by RN. Patient Discharged from ED in wheelchair to Home via Self.

## 2022-10-17 NOTE — Telephone Encounter (Signed)
Final Disposition: Go to ED Now--Patient understood and agreed to comply.   Patient Name: Amy Santana [facility] Gender: Female DOB: 07-Mar-1971 Age: 52 Y 22 M 23 D Return Phone Number: [phone] (Primary) Address: City/ State/ Zip: [place]  [address] Client [place] [facility] [place] Client Site [place] [facility] [place] Contact Type Call Who Is Calling Patient / Member / Family / Caregiver Call Type Triage / Clinical Relationship To Patient Self Return Phone Number [phone] (Primary) Chief Complaint Leg Swelling And Edema Reason for Call Symptomatic / Request for [place] states, patient is having leg pain , since yesterday. she has ankle swelling and calf pain. Her Dr. is Cherlynn Kaiser. Office has no appt. for today. hard to walk. [place] in [place] Translation No Nurse Assessment Nurse: Zorita Pang, RN, Neoma Laming Date/Time (Eastern Time): 10/17/2022 9:10:13 AM Confirm and document reason for call. If symptomatic, describe symptoms. ---The caller states that she is having left calf pain that started yesterday. No history of blood clots or injury. Having difficulty walking and is limping. Does the patient have any new or worsening symptoms? ---Yes Will a triage be completed? ---Yes Related visit to physician within the last 2 weeks? ---No Does the PT have any chronic conditions? (i.e. diabetes, asthma, this includes High risk factors for pregnancy, etc.) ---No Is the patient pregnant or possibly pregnant? (Ask all females between the ages of 25-55) ---No Is this a behavioral health or substance abuse call? ---No Guidelines Guideline Title Affirmed Question Affirmed Notes Nurse Date/Time Eilene Ghazi Time) Leg Swelling and Edema [1] Can't walk or can barely walk AND [2] new-onset [place], RN, Neoma Laming 10/17/2022 9:12:00 AM PLEASE NOTE: All timestamps  contained within this report are represented as [place] Standard Time. CONFIDENTIALTY NOTICE: This fax transmission is intended only for the addressee. It contains information that is legally privileged, confidential or otherwise protected from use or disclosure. If you are not the intended recipient, you are strictly prohibited from reviewing, disclosing, copying using or disseminating any of this information or taking any action in reliance on or regarding this information. If you have received this fax in error, please notify us immediately by telephone so that we can arrange for its return to [place]. Phone: [phone], Toll-Free: [phone], Fax: [phone] Page: 2 of 2 Call Id: [id number] [place]. Time Eilene Ghazi Time) Disposition Final User 10/17/2022 9:17:27 AM Go to ED Now Yes Zorita Pang, RN, Neoma Laming Final Disposition 10/17/2022 9:17:27 AM Go to ED Now Yes Zorita Pang, RN, Garrel Ridgel Disagree/Comply Comply Caller Understands Yes PreDisposition Call Doctor Care Advice Given Per Guideline GO TO ED NOW: * You need to be seen in the Emergency Department. Referrals GO TO FACILITY OTHER - SPECIFY

## 2022-10-17 NOTE — ED Triage Notes (Signed)
Patient arrives with complaints of worsening right foot/ankle pain x4 days. Rates pain an 8/10.   No falls or recent injuries.

## 2023-01-04 NOTE — Progress Notes (Signed)
Surgical Instructions    Your procedure is scheduled on Wednesday, 01/11/23.  Report to [facility] Main Entrance "A" at 6:30 A.M., then check in with the Admitting office.  Call this number if you have problems the morning of surgery:  [phone]   If you have any questions prior to your surgery date call [phone]: Open Monday-Friday 8am-4pm If you experience any cold or flu symptoms such as cough, fever, chills, shortness of breath, etc. between now and your scheduled surgery, please notify us at the above number     Remember:  Do not eat after midnight the night before your surgery  You may drink clear liquids until 5:30am the morning of your surgery.   Clear liquids allowed are: Water, Non-Citrus Juices (without pulp), Carbonated Beverages, Clear Tea, Black Coffee ONLY (NO MILK, CREAM OR POWDERED CREAMER of any kind), and Gatorade    Take these medicines the morning of surgery with A SIP OF WATER:  acetaminophen (TYLENOL)- if needed fluticasone (FLONASE) - if needed loratadine (CLARITIN) - if needed  As of today, STOP taking any Aspirin (unless otherwise instructed by your surgeon) Aleve, Naproxen, Ibuprofen, Motrin, Advil, Goody's, BC's, all herbal medications, fish oil, and all vitamins.           Do not wear jewelry or makeup. Do not wear lotions, powders, perfumes or deodorant. Do not shave 48 hours prior to surgery.   Do not bring valuables to the hospital. Do not wear nail polish, gel polish, artificial nails, or any other type of covering on natural nails (fingers and toes) If you have artificial nails or gel coating that need to be removed by a nail salon, please have this removed prior to surgery. Artificial nails or gel coating may interfere with anesthesia's ability to adequately monitor your vital signs.  [facility] is not responsible for any belongings or valuables.    Do NOT Smoke (Tobacco/Vaping)  24 hours prior to your procedure  If you use a CPAP at  night, you may bring your mask for your overnight stay.   Contacts, glasses, hearing aids, dentures or partials may not be worn into surgery, please bring cases for these belongings   For patients admitted to the hospital, discharge time will be determined by your treatment team.   Patients discharged the day of surgery will not be allowed to drive home, and someone needs to stay with them for 24 hours.   SURGICAL WAITING ROOM VISITATION Patients having surgery or a procedure may have no more than 2 support people in the waiting area - these visitors may rotate.   Children under the age of 7 must have an adult with them who is not the patient. If the patient needs to stay at the hospital during part of their recovery, the visitor guidelines for inpatient rooms apply. Pre-op nurse will coordinate an appropriate time for 1 support person to accompany patient in pre-op.  This support person may not rotate.   Please refer to [email or web address] for the visitor guidelines for Inpatients (after your surgery is over and you are in a regular room).    Special instructions:    Oral Hygiene is also important to reduce your risk of infection.  Remember - BRUSH YOUR TEETH THE MORNING OF SURGERY WITH YOUR REGULAR TOOTHPASTE   [place]- Preparing For Surgery  Before surgery, you can play an important role. Because skin is not sterile, your skin needs to be as free of germs as possible. You can reduce  the number of germs on your skin by washing with CHG (chlorahexidine gluconate) Soap before surgery.  CHG is an antiseptic cleaner which kills germs and bonds with the skin to continue killing germs even after washing.     Please do not use if you have an allergy to CHG or antibacterial soaps. If your skin becomes reddened/irritated stop using the CHG.  Do not shave (including legs and underarms) for at least 48 hours prior to first CHG shower. It is  OK to shave your face.  Please follow these instructions carefully.     Shower the NIGHT BEFORE SURGERY and the MORNING OF SURGERY with CHG Soap.   If you chose to wash your hair, wash your hair first as usual with your normal shampoo. After you shampoo, rinse your hair and body thoroughly to remove the shampoo.  Then [employer] and genitals (private parts) with your normal soap and rinse thoroughly to remove soap.  After that Use CHG Soap as you would any other liquid soap. You can apply CHG directly to the skin and wash gently with a scrungie or a clean washcloth.   Apply the CHG Soap to your body ONLY FROM THE NECK DOWN.  Do not use on open wounds or open sores. Avoid contact with your eyes, ears, mouth and genitals (private parts). Wash Face and genitals (private parts)  with your normal soap.   Wash thoroughly, paying special attention to the area where your surgery will be performed.  Thoroughly rinse your body with warm water from the neck down.  DO NOT shower/wash with your normal soap after using and rinsing off the CHG Soap.  Pat yourself dry with a CLEAN TOWEL.  Wear CLEAN PAJAMAS to bed the night before surgery  Place CLEAN SHEETS on your bed the night before your surgery  DO NOT SLEEP WITH PETS.   Day of Surgery: Take a shower with CHG soap. Wear Clean/Comfortable clothing the morning of surgery Do not apply any deodorants/lotions.   Remember to brush your teeth WITH YOUR REGULAR TOOTHPASTE.    If you received a COVID test during your pre-op visit, it is requested that you wear a mask when out in public, stay away from anyone that may not be feeling well, and notify your surgeon if you develop symptoms. If you have been in contact with anyone that has tested positive in the last 10 days, please notify your surgeon.    Please read over the following fact sheets that you were given.

## 2023-01-05 ENCOUNTER — Encounter (HOSPITAL_COMMUNITY): Payer: Self-pay

## 2023-01-05 ENCOUNTER — Other Ambulatory Visit: Payer: Self-pay

## 2023-01-05 ENCOUNTER — Encounter (HOSPITAL_COMMUNITY)
Admission: RE | Admit: 2023-01-05 | Discharge: 2023-01-05 | Disposition: A | Discharge status: Home / Self Care | Payer: BC Managed Care – PPO | Source: Ambulatory Visit | Attending: Obstetrics and Gynecology | Admitting: Obstetrics and Gynecology

## 2023-01-05 DIAGNOSIS — D251 Intramural leiomyoma of uterus: Secondary | ICD-10-CM | POA: Diagnosis not present

## 2023-01-05 DIAGNOSIS — Z01812 Encounter for preprocedural laboratory examination: Secondary | ICD-10-CM | POA: Insufficient documentation

## 2023-01-05 LAB — CBC
HCT: 35.7 % — ABNORMAL LOW (ref 36.0–46.0)
Hemoglobin: 11.3 g/dL — ABNORMAL LOW (ref 12.0–15.0)
MCH: 29.4 pg (ref 26.0–34.0)
MCHC: 31.7 g/dL (ref 30.0–36.0)
MCV: 92.7 fL (ref 80.0–100.0)
Platelets: 191 10*3/uL (ref 150–400)
RBC: 3.85 MIL/uL — ABNORMAL LOW (ref 3.87–5.11)
RDW: 14.6 % (ref 11.5–15.5)
WBC: 3.9 10*3/uL — ABNORMAL LOW (ref 4.0–10.5)
nRBC: 0 % (ref 0.0–0.2)

## 2023-01-05 LAB — TYPE AND SCREEN
ABO/RH(D): B POS
Antibody Screen: NEGATIVE

## 2023-01-05 NOTE — Progress Notes (Signed)
PCP -    Jeani Sow, MD   Cardiologist - denies  PPM/ICD - denies   Chest x-ray - N/A EKG - N/A Stress Test - denies ECHO - denies Cardiac Cath - denies  Sleep Study - denies   Fasting Blood Sugar - N/A   Last dose of GLP1 agonist-  N/A   Blood Thinner Instructions: N/A Aspirin Instructions:N/A  ERAS Protcol - ERAS per order    COVID TEST- N/A   Anesthesia review: no  Patient denies shortness of breath, fever, cough and chest pain at PAT appointment   All instructions explained to the patient, with a verbal understanding of the material. Patient agrees to go over the instructions while at home for a better understanding. The opportunity to ask questions was provided.

## 2023-01-10 NOTE — H&P (Signed)
Amy Santana is an 52 y.o. female. She says she is actually only 48, but cannot get DOB officially changed.  She was seen last May as a new patient.  She has regular menses monthly, 3 days heavy, 5 days total, some cramps, some discomfort from fibroids.  She still wants to try to conceive.  Exam with 22-24 week size myomatous uterus.  Pelvic ultrasound with at least 7 measurable myomas, the largest is 8+ cm.  All medical and surgical options have been discussed, she wants abdominal myomectomy  Pertinent Gynecological History: Last mammogram: normal Date: 01/2022 Last pap: normal Date: 2022 OB History: G3, P3003 SVD x 3   Menstrual History: Patient's last menstrual period was 12/19/2022.    Past Medical History:  Diagnosis Date   Uterine fibroid     Past Surgical History:  Procedure Laterality Date   NASAL POLYP EXCISION     WISDOM TOOTH EXTRACTION      Family History  Problem Relation Age of Onset   Hypertension Mother     Social History:  reports that she has never smoked. She has never used smokeless tobacco. She reports that she does not drink alcohol and does not use drugs.  Allergies: No Known Allergies  No medications prior to admission.    Review of Systems  Respiratory: Negative.    Cardiovascular: Negative.     Last menstrual period 12/19/2022. Physical Exam Constitutional:      Appearance: Normal appearance.  Cardiovascular:     Rate and Rhythm: Normal rate and regular rhythm.     Heart sounds: Normal heart sounds. No murmur heard. Pulmonary:     Effort: Pulmonary effort is normal. No respiratory distress.     Breath sounds: Normal breath sounds. No wheezing.  Abdominal:     General: There is no distension.     Palpations: Abdomen is soft. There is mass (uterine fundus at U+2).     Tenderness: There is no abdominal tenderness.  Genitourinary:    General: Normal vulva.     Comments: Uterus 22-24 weeks size Musculoskeletal:     Cervical back:  Normal range of motion and neck supple.  Neurological:     Mental Status: She is alert.     No results found for this or any previous visit (from the past 24 hour(s)).  No results found.  Assessment/Plan: Symptomatic, large, myomatous uterus.  All medical and surgical options have been discussed, she wants to maintain option to possibly conceive.  Abdominal myomectomy surgical procedure, risks, alternatives, chances of relieving symptoms, need for c-section in the future, have all been discussed, questions answered.  Will admit for abdominal myomectomy  Amy Santana 01/10/2023, 6:55 PM

## 2023-01-10 NOTE — Anesthesia Preprocedure Evaluation (Signed)
Anesthesia Evaluation  Patient identified by MRN, date of birth, ID band Patient awake    Reviewed: Allergy & Precautions, H&P , NPO status , Patient's Chart, lab work & pertinent test results  Airway Mallampati: II  TM Distance: >3 FB Neck ROM: Full    Dental no notable dental hx. (+) Teeth Intact, Dental Advisory Given   Pulmonary neg pulmonary ROS   Pulmonary exam normal breath sounds clear to auscultation       Cardiovascular Exercise Tolerance: Good negative cardio ROS  Rhythm:Regular Rate:Normal     Neuro/Psych negative neurological ROS  negative psych ROS   GI/Hepatic Neg liver ROS,GERD  Medicated,,  Endo/Other  negative endocrine ROS    Renal/GU negative Renal ROS  negative genitourinary   Musculoskeletal   Abdominal   Peds  Hematology negative hematology ROS (+)   Anesthesia Other Findings   Reproductive/Obstetrics negative OB ROS                             Anesthesia Physical Anesthesia Plan  ASA: 2  Anesthesia Plan: General   Post-op Pain Management: Regional block*, Toradol IV (intra-op)* and Tylenol PO (pre-op)*   Induction: Intravenous  PONV Risk Score and Plan: 4 or greater and Ondansetron, Dexamethasone and Midazolam  Airway Management Planned: Oral ETT  Additional Equipment:   Intra-op Plan:   Post-operative Plan: Extubation in OR  Informed Consent: I have reviewed the patients History and Physical, chart, labs and discussed the procedure including the risks, benefits and alternatives for the proposed anesthesia with the patient or authorized representative who has indicated his/her understanding and acceptance.     Dental advisory given  Plan Discussed with: CRNA  Anesthesia Plan Comments:        Anesthesia Quick Evaluation

## 2023-01-11 ENCOUNTER — Encounter (HOSPITAL_COMMUNITY)
Admission: RE | Disposition: A | Discharge status: Home / Self Care | Payer: Self-pay | Source: Home / Self Care | Attending: Obstetrics and Gynecology

## 2023-01-11 ENCOUNTER — Other Ambulatory Visit: Payer: Self-pay

## 2023-01-11 ENCOUNTER — Inpatient Hospital Stay (HOSPITAL_COMMUNITY): Payer: BC Managed Care – PPO | Admitting: Anesthesiology

## 2023-01-11 ENCOUNTER — Inpatient Hospital Stay (HOSPITAL_COMMUNITY)
Admission: RE | Admit: 2023-01-11 | Discharge: 2023-01-13 | DRG: 743 | Disposition: A | Discharge status: Home / Self Care | Payer: BC Managed Care – PPO | Attending: Obstetrics and Gynecology | Admitting: Obstetrics and Gynecology

## 2023-01-11 ENCOUNTER — Encounter (HOSPITAL_COMMUNITY): Payer: Self-pay | Admitting: Obstetrics and Gynecology

## 2023-01-11 DIAGNOSIS — D251 Intramural leiomyoma of uterus: Secondary | ICD-10-CM

## 2023-01-11 DIAGNOSIS — Z8249 Family history of ischemic heart disease and other diseases of the circulatory system: Secondary | ICD-10-CM | POA: Diagnosis not present

## 2023-01-11 DIAGNOSIS — D259 Leiomyoma of uterus, unspecified: Secondary | ICD-10-CM

## 2023-01-11 DIAGNOSIS — D252 Subserosal leiomyoma of uterus: Principal | ICD-10-CM | POA: Diagnosis present

## 2023-01-11 HISTORY — PX: MYOMECTOMY: SHX85

## 2023-01-11 LAB — CBC WITH DIFFERENTIAL/PLATELET
Abs Immature Granulocytes: 0.03 10*3/uL (ref 0.00–0.07)
Basophils Absolute: 0 10*3/uL (ref 0.0–0.1)
Basophils Relative: 0 %
Eosinophils Absolute: 0 10*3/uL (ref 0.0–0.5)
Eosinophils Relative: 0 %
HCT: 31.8 % — ABNORMAL LOW (ref 36.0–46.0)
Hemoglobin: 10.1 g/dL — ABNORMAL LOW (ref 12.0–15.0)
Immature Granulocytes: 0 %
Lymphocytes Relative: 3 %
Lymphs Abs: 0.3 10*3/uL — ABNORMAL LOW (ref 0.7–4.0)
MCH: 29.4 pg (ref 26.0–34.0)
MCHC: 31.8 g/dL (ref 30.0–36.0)
MCV: 92.7 fL (ref 80.0–100.0)
Monocytes Absolute: 0.2 10*3/uL (ref 0.1–1.0)
Monocytes Relative: 2 %
Neutro Abs: 9.3 10*3/uL — ABNORMAL HIGH (ref 1.7–7.7)
Neutrophils Relative %: 95 %
Platelets: 154 10*3/uL (ref 150–400)
RBC: 3.43 MIL/uL — ABNORMAL LOW (ref 3.87–5.11)
RDW: 14.4 % (ref 11.5–15.5)
WBC: 9.8 10*3/uL (ref 4.0–10.5)
nRBC: 0 % (ref 0.0–0.2)

## 2023-01-11 LAB — POCT PREGNANCY, URINE: Preg Test, Ur: NEGATIVE

## 2023-01-11 SURGERY — MYOMECTOMY, ABDOMINAL APPROACH
Anesthesia: General

## 2023-01-11 MED ORDER — HYDROMORPHONE HCL 1 MG/ML IJ SOLN
INTRAMUSCULAR | Status: AC
  Filled 2023-01-11: qty 0.5

## 2023-01-11 MED ORDER — DEXTROSE-NACL 5-0.45 % IV SOLN: INTRAVENOUS | Status: DC

## 2023-01-11 MED ORDER — DEXAMETHASONE SODIUM PHOSPHATE 10 MG/ML IJ SOLN
INTRAMUSCULAR | Status: DC | PRN
  Administered 2023-01-11: 10 mg via INTRAVENOUS

## 2023-01-11 MED ORDER — BISACODYL 10 MG RE SUPP: 10.0000 mg | Freq: Every day | RECTAL | Status: DC | PRN

## 2023-01-11 MED ORDER — HYDROMORPHONE HCL 1 MG/ML IJ SOLN: 1.0000 mg | INTRAMUSCULAR | Status: DC | PRN

## 2023-01-11 MED ORDER — BUPIVACAINE-EPINEPHRINE (PF) 0.5% -1:200000 IJ SOLN
INTRAMUSCULAR | Status: DC | PRN
  Administered 2023-01-11 (×2): 20 mL via PERINEURAL

## 2023-01-11 MED ORDER — VASOPRESSIN 20 UNIT/ML IV SOLN
INTRAVENOUS | Status: AC
  Filled 2023-01-11: qty 1

## 2023-01-11 MED ORDER — SODIUM CHLORIDE (PF) 0.9 % IJ SOLN
INTRAMUSCULAR | Status: AC
  Filled 2023-01-11: qty 50

## 2023-01-11 MED ORDER — FENTANYL CITRATE (PF) 250 MCG/5ML IJ SOLN
INTRAMUSCULAR | Status: AC
  Filled 2023-01-11: qty 5

## 2023-01-11 MED ORDER — CHLORHEXIDINE GLUCONATE 0.12 % MT SOLN
OROMUCOSAL | Status: AC
  Administered 2023-01-11: 15 mL
  Filled 2023-01-11: qty 15

## 2023-01-11 MED ORDER — 0.9 % SODIUM CHLORIDE (POUR BTL) OPTIME
TOPICAL | Status: DC | PRN
  Administered 2023-01-11: 1000 mL

## 2023-01-11 MED ORDER — SUGAMMADEX SODIUM 200 MG/2ML IV SOLN
INTRAVENOUS | Status: DC | PRN
  Administered 2023-01-11: 350 mg via INTRAVENOUS

## 2023-01-11 MED ORDER — FENTANYL CITRATE (PF) 250 MCG/5ML IJ SOLN
INTRAMUSCULAR | Status: DC | PRN
  Administered 2023-01-11: 50 ug via INTRAVENOUS
  Administered 2023-01-11 (×2): 100 ug via INTRAVENOUS

## 2023-01-11 MED ORDER — ACETAMINOPHEN 500 MG PO TABS
1000.0000 mg | ORAL_TABLET | Freq: Once | ORAL | Status: AC
  Administered 2023-01-11: 1000 mg via ORAL
  Filled 2023-01-11: qty 2

## 2023-01-11 MED ORDER — TRANEXAMIC ACID-NACL 1000-0.7 MG/100ML-% IV SOLN
INTRAVENOUS | Status: AC
  Filled 2023-01-11: qty 100

## 2023-01-11 MED ORDER — MIDAZOLAM HCL 2 MG/2ML IJ SOLN
INTRAMUSCULAR | Status: DC | PRN
  Administered 2023-01-11: 2 mg via INTRAVENOUS

## 2023-01-11 MED ORDER — TRANEXAMIC ACID 1000 MG/10ML IV SOLN
INTRAVENOUS | Status: DC | PRN
  Administered 2023-01-11: 1000 mg via INTRAVENOUS

## 2023-01-11 MED ORDER — HYDROMORPHONE HCL 1 MG/ML IJ SOLN: 0.2500 mg | INTRAMUSCULAR | Status: DC | PRN

## 2023-01-11 MED ORDER — ROCURONIUM BROMIDE 10 MG/ML (PF) SYRINGE
PREFILLED_SYRINGE | INTRAVENOUS | Status: DC | PRN
  Administered 2023-01-11: 60 mg via INTRAVENOUS
  Administered 2023-01-11 (×2): 20 mg via INTRAVENOUS

## 2023-01-11 MED ORDER — MENTHOL 3 MG MT LOZG: 1.0000 | LOZENGE | OROMUCOSAL | Status: DC | PRN

## 2023-01-11 MED ORDER — ACETAMINOPHEN 500 MG PO TABS
1000.0000 mg | ORAL_TABLET | Freq: Four times a day (QID) | ORAL | Status: DC
  Administered 2023-01-11 – 2023-01-12 (×5): 1000 mg via ORAL
  Filled 2023-01-11 (×6): qty 2

## 2023-01-11 MED ORDER — ALUM & MAG HYDROXIDE-SIMETH 200-200-20 MG/5ML PO SUSP: 30.0000 mL | ORAL | Status: DC | PRN

## 2023-01-11 MED ORDER — ONDANSETRON HCL 4 MG/2ML IJ SOLN
4.0000 mg | Freq: Four times a day (QID) | INTRAMUSCULAR | Status: DC | PRN
  Administered 2023-01-11: 4 mg via INTRAVENOUS
  Filled 2023-01-11: qty 2

## 2023-01-11 MED ORDER — METHYLENE BLUE 1 % INJ SOLN
INTRAVENOUS | Status: AC
  Filled 2023-01-11: qty 10

## 2023-01-11 MED ORDER — KETOROLAC TROMETHAMINE 30 MG/ML IJ SOLN
INTRAMUSCULAR | Status: DC | PRN
  Administered 2023-01-11: 30 mg via INTRAVENOUS

## 2023-01-11 MED ORDER — LIDOCAINE 2% (20 MG/ML) 5 ML SYRINGE
INTRAMUSCULAR | Status: AC
  Filled 2023-01-11: qty 5

## 2023-01-11 MED ORDER — DEXAMETHASONE SODIUM PHOSPHATE 10 MG/ML IJ SOLN
INTRAMUSCULAR | Status: AC
  Filled 2023-01-11: qty 1

## 2023-01-11 MED ORDER — ONDANSETRON HCL 4 MG/2ML IJ SOLN
INTRAMUSCULAR | Status: AC
  Filled 2023-01-11: qty 2

## 2023-01-11 MED ORDER — ROCURONIUM BROMIDE 10 MG/ML (PF) SYRINGE
PREFILLED_SYRINGE | INTRAVENOUS | Status: AC
  Filled 2023-01-11: qty 10

## 2023-01-11 MED ORDER — SIMETHICONE 80 MG PO CHEW: 80.0000 mg | CHEWABLE_TABLET | Freq: Four times a day (QID) | ORAL | Status: DC | PRN

## 2023-01-11 MED ORDER — PROPOFOL 10 MG/ML IV BOLUS
INTRAVENOUS | Status: AC
  Filled 2023-01-11: qty 20

## 2023-01-11 MED ORDER — PHENYLEPHRINE 80 MCG/ML (10ML) SYRINGE FOR IV PUSH (FOR BLOOD PRESSURE SUPPORT)
PREFILLED_SYRINGE | INTRAVENOUS | Status: AC
  Filled 2023-01-11: qty 10

## 2023-01-11 MED ORDER — GABAPENTIN 300 MG PO CAPS
300.0000 mg | ORAL_CAPSULE | ORAL | Status: AC
  Administered 2023-01-11: 300 mg via ORAL
  Filled 2023-01-11: qty 1

## 2023-01-11 MED ORDER — ALBUMIN HUMAN 5 % IV SOLN: INTRAVENOUS | Status: DC | PRN

## 2023-01-11 MED ORDER — BUPIVACAINE LIPOSOME 1.3 % IJ SUSP
INTRAMUSCULAR | Status: DC | PRN
  Administered 2023-01-11 (×2): 5 mL via PERINEURAL

## 2023-01-11 MED ORDER — PHENYLEPHRINE 80 MCG/ML (10ML) SYRINGE FOR IV PUSH (FOR BLOOD PRESSURE SUPPORT)
PREFILLED_SYRINGE | INTRAVENOUS | Status: DC | PRN
  Administered 2023-01-11 (×4): 80 ug via INTRAVENOUS
  Administered 2023-01-11: 160 ug via INTRAVENOUS

## 2023-01-11 MED ORDER — PROPOFOL 10 MG/ML IV BOLUS
INTRAVENOUS | Status: DC | PRN
  Administered 2023-01-11: 140 mg via INTRAVENOUS

## 2023-01-11 MED ORDER — VASOPRESSIN 20 UNIT/ML IV SOLN
INTRAVENOUS | Status: DC | PRN
  Administered 2023-01-11: 10 mL via INTRAMUSCULAR
  Administered 2023-01-11: 15 mL via INTRAMUSCULAR

## 2023-01-11 MED ORDER — MIDAZOLAM HCL 2 MG/2ML IJ SOLN
INTRAMUSCULAR | Status: AC
  Filled 2023-01-11: qty 2

## 2023-01-11 MED ORDER — IBUPROFEN 600 MG PO TABS
600.0000 mg | ORAL_TABLET | Freq: Four times a day (QID) | ORAL | Status: DC
  Administered 2023-01-12 (×2): 600 mg via ORAL
  Filled 2023-01-11 (×3): qty 1

## 2023-01-11 MED ORDER — LIDOCAINE 2% (20 MG/ML) 5 ML SYRINGE
INTRAMUSCULAR | Status: DC | PRN
  Administered 2023-01-11: 40 mg via INTRAVENOUS

## 2023-01-11 MED ORDER — SCOPOLAMINE 1 MG/3DAYS TD PT72
1.0000 | MEDICATED_PATCH | TRANSDERMAL | Status: DC
  Administered 2023-01-11: 1.5 mg via TRANSDERMAL
  Filled 2023-01-11: qty 1

## 2023-01-11 MED ORDER — HYDROMORPHONE HCL 1 MG/ML IJ SOLN
INTRAMUSCULAR | Status: DC | PRN
  Administered 2023-01-11: .5 mg via INTRAVENOUS

## 2023-01-11 MED ORDER — ONDANSETRON HCL 4 MG PO TABS: 4.0000 mg | ORAL_TABLET | Freq: Four times a day (QID) | ORAL | Status: DC | PRN

## 2023-01-11 MED ORDER — EPHEDRINE 5 MG/ML INJ
INTRAVENOUS | Status: AC
  Filled 2023-01-11: qty 5

## 2023-01-11 MED ORDER — KETOROLAC TROMETHAMINE 30 MG/ML IJ SOLN
30.0000 mg | Freq: Four times a day (QID) | INTRAMUSCULAR | Status: AC
  Administered 2023-01-11 – 2023-01-12 (×4): 30 mg via INTRAVENOUS
  Filled 2023-01-11 (×4): qty 1

## 2023-01-11 MED ORDER — OXYCODONE HCL 5 MG PO TABS
5.0000 mg | ORAL_TABLET | ORAL | Status: DC | PRN
  Administered 2023-01-12 (×3): 10 mg via ORAL
  Administered 2023-01-12: 5 mg via ORAL
  Administered 2023-01-13 (×2): 10 mg via ORAL
  Filled 2023-01-11 (×5): qty 2
  Filled 2023-01-11: qty 1

## 2023-01-11 MED ORDER — CEFAZOLIN SODIUM-DEXTROSE 2-4 GM/100ML-% IV SOLN
2.0000 g | INTRAVENOUS | Status: AC
  Administered 2023-01-11: 2 g via INTRAVENOUS
  Filled 2023-01-11: qty 100

## 2023-01-11 MED ORDER — ONDANSETRON HCL 4 MG/2ML IJ SOLN
INTRAMUSCULAR | Status: DC | PRN
  Administered 2023-01-11: 4 mg via INTRAVENOUS

## 2023-01-11 MED ORDER — TRANEXAMIC ACID-NACL 1000-0.7 MG/100ML-% IV SOLN
1000.0000 mg | Freq: Once | INTRAVENOUS | Status: AC
  Administered 2023-01-11: 1000 mg via INTRAVENOUS
  Filled 2023-01-11: qty 100

## 2023-01-11 MED ORDER — POVIDONE-IODINE 10 % EX SWAB
2.0000 | Freq: Once | CUTANEOUS | Status: AC
Start: 2023-01-11 — End: 2023-01-11
  Administered 2023-01-11: 2 via TOPICAL

## 2023-01-11 MED ORDER — LACTATED RINGERS IV SOLN: INTRAVENOUS | Status: DC

## 2023-01-11 SURGICAL SUPPLY — 41 items
BARRIER ADHS 3X4 INTERCEED (GAUZE/BANDAGES/DRESSINGS) IMPLANT
BRR ADH 4X3 ABS CNTRL BYND (GAUZE/BANDAGES/DRESSINGS) ×1
CANISTER SUCT 3000ML PPV (MISCELLANEOUS) ×1 IMPLANT
DRAPE CESAREAN BIRTH W POUCH (DRAPES) ×1 IMPLANT
DRAPE WARM FLUID 44X44 (DRAPES) IMPLANT
DRSG OPSITE 6X11 MED (GAUZE/BANDAGES/DRESSINGS) IMPLANT
DRSG OPSITE POSTOP 4X10 (GAUZE/BANDAGES/DRESSINGS) ×1 IMPLANT
DURAPREP 26ML APPLICATOR (WOUND CARE) ×1 IMPLANT
ELECT BLADE 4.0 EZ CLEAN MEGAD (MISCELLANEOUS) ×1
ELECTRODE BLDE 4.0 EZ CLN MEGD (MISCELLANEOUS) IMPLANT
GAUZE 4X4 16PLY ~~LOC~~+RFID DBL (SPONGE) IMPLANT
GLOVE BIOGEL PI IND STRL 7.0 (GLOVE) ×2 IMPLANT
GLOVE ORTHO TXT STRL SZ7.5 (GLOVE) ×1 IMPLANT
GOWN STRL REUS W/ TWL LRG LVL3 (GOWN DISPOSABLE) ×2 IMPLANT
GOWN STRL REUS W/TWL LRG LVL3 (GOWN DISPOSABLE) ×2
KIT TURNOVER KIT B (KITS) ×1 IMPLANT
NEEDLE HYPO 22GX1.5 SAFETY (NEEDLE) ×1 IMPLANT
NS IRRIG 1000ML POUR BTL (IV SOLUTION) ×1 IMPLANT
PACK ABDOMINAL GYN (CUSTOM PROCEDURE TRAY) ×1 IMPLANT
PAD ARMBOARD 7.5X6 YLW CONV (MISCELLANEOUS) ×1 IMPLANT
PAD OB MATERNITY 4.3X12.25 (PERSONAL CARE ITEMS) ×1 IMPLANT
PENCIL SMOKE EVACUATOR (MISCELLANEOUS) ×1 IMPLANT
RETRACTOR WND ALEXIS 25 LRG (MISCELLANEOUS) IMPLANT
RTRCTR WOUND ALEXIS 25CM LRG (MISCELLANEOUS)
SPECIMEN JAR MEDIUM (MISCELLANEOUS) ×1 IMPLANT
SPIKE FLUID TRANSFER (MISCELLANEOUS) IMPLANT
SPONGE T-LAP 18X18 ~~LOC~~+RFID (SPONGE) ×2 IMPLANT
STAPLER SKIN 35 WIDE (STAPLE) IMPLANT
SUT PDS AB 0 CTX 60 (SUTURE) IMPLANT
SUT PLAIN 2 0 XLH (SUTURE) IMPLANT
SUT VIC AB 0 CT1 18XCR BRD8 (SUTURE) ×2 IMPLANT
SUT VIC AB 0 CT1 27 (SUTURE) ×2
SUT VIC AB 0 CT1 27XBRD ANBCTR (SUTURE) ×2 IMPLANT
SUT VIC AB 0 CT1 8-18 (SUTURE) ×2
SUT VIC AB 3-0 SH 27 (SUTURE) ×4
SUT VIC AB 3-0 SH 27X BRD (SUTURE) ×2 IMPLANT
SUT VIC AB 4-0 KS 27 (SUTURE) IMPLANT
SUT VICRYL 0 TIES 12 18 (SUTURE) ×1 IMPLANT
SYR CONTROL 10ML LL (SYRINGE) ×1 IMPLANT
TOWEL GREEN STERILE FF (TOWEL DISPOSABLE) ×2 IMPLANT
TRAY FOLEY W/BAG SLVR 14FR (SET/KITS/TRAYS/PACK) ×1 IMPLANT

## 2023-01-11 NOTE — Brief Op Note (Signed)
01/11/2023  10:54 AM  PATIENT:  Amy Santana  52 y.o. female  PRE-OPERATIVE DIAGNOSIS:  symptomatic fibroids  POST-OPERATIVE DIAGNOSIS:  symptomatic fibroids  PROCEDURE:  Procedure(s): ABDOMINAL MYOMECTOMY (N/A)  SURGEON:  Surgeon(s) and Role:    * Mustapha Colson, MD - Primary    * Shivaji, Valerie Roys, MD - Assisting  PHYSICIAN ASSISTANT:   ASSISTANTS: Ellison Hughs, MD   ANESTHESIA:   general and TAP block  EBL:  400 mL   BLOOD ADMINISTERED:none  DRAINS: none   LOCAL MEDICATIONS USED:  NONE  SPECIMEN:  Source of Specimen:  fibroids  DISPOSITION OF SPECIMEN:  PATHOLOGY  COUNTS:  YES  TOURNIQUET:  * No tourniquets in log *  DICTATION: .Dragon Dictation  PLAN OF CARE: Admit to inpatient   PATIENT DISPOSITION:  PACU - hemodynamically stable.   Delay start of Pharmacological VTE agent (>24hrs) due to surgical blood loss or risk of bleeding: yes

## 2023-01-11 NOTE — Op Note (Signed)
Preoperative diagnosis: Symptomatic leiomyomatous uterus Postoperative diagnosis:  Same  Procedure: Abdominal myomectomy Surgeon: Lavina Hamman M.D. Assistant: Ellison Hughs, MD Anesthesia: Gen. Endotracheal tube, bilateral TAP block Findings: 24 week size irregular uterus with several serosal and intramural myomas Estimated blood loss: 400 cc Complications: None  Procedure in detail:  The patient was taken to the operating room and placed in the dorsal supine position. Bilateral TAP block had already been placed.  General anesthesia was induced. Abdomen, perineum and vagina were then prepped and draped in the usual sterile fashion and a Foley catheter was inserted. Abdomen was entered via a standard Pfannenstiel incision without difficulty. The uterus was able to be delivered through the incision and one moist lap was used to pack the bowels away from the uterus. On inspection there were several serosal  fibroids as well as intramural. At all times I was careful to avoid where the tubes came into the myometrium. A calcified 3 cm serosal myomas was removed from right fundus with Bovie.  Pitressin was then infiltrated across the fundus. Incision was made in the fundus with Bovie, and a midline and right posterior myomas were removed with Bovie.  Endometrial cavity was not entered.  Myometrium here was then reapproximated with 2 layers of interrupted 0 Vicryl. A posterior serosal myoma was removed with bovie and the defect closed with one figure 8 0 Vicryl.  Another calcified serosal myoma left anterior was removed with bovie. Pitressin was infiltrated across anterior fundus and another incision made with Bovie.  2 more large myomas removed with bovie.  Myometrium closed with 2 layers of interrupted 0 Vicryl. Careful palpation the uterus revealed no further fibroids. It did appear that I was able to stay away from the tubes. Both lond incisions were closed with a final baseball stitch with 3-0 Vicryl.  The uterine incisions were hemostatic enough that I was able to place Interceed over the incisions after  the lap pad in the abdomen was removed. The uterus was then gently replaced in the abdomen. The fascia was closed in running fashion starting at both ends and meeting in the middle with 0 Vicryl. Subcutaneous tissue was irrigated and made hemostatic with electrocautery. Subcutaneous tissue was closed with running 2-0 plain gut suture. Skin was closed with staples followed by a sterile bandage. The patient was awakened in the operating room after tolerating the procedure well. She was taken to the recovery room in stable condition. All counts were correct, she was given Ancef 2 g IV the beginning of the procedure and had PAS hose on throughout the procedure.  She also received one dose of TXA during the procedure.

## 2023-01-11 NOTE — Progress Notes (Signed)
Post-op check, s/p abdominal myomectomy  Had some bleeding from incision in PACU, honeycomb replaced and I ordered a pressure dressing.  She has had some more bleeding and dressing was changed by RN again.    She feels good, minimal pain, no n/v, tol liquids Afeb, VSS Abd- soft, mild tenderness, fundus slightly tender at U-1.  Dressing removed, some bleeding from left edge mostly controlled with pressure.  A new honeycomb and pressure dressing applied  Overall doing well except for more bleeding from incision than I expected.  Dressing changed and pressure dressing applied.  Will give a second dose of TXA, check CBC, continue routine care otherwise.

## 2023-01-11 NOTE — Anesthesia Procedure Notes (Signed)
Anesthesia Regional Block: TAP block   Pre-Anesthetic Checklist: , timeout performed,  Correct Patient, Correct Site, Correct Laterality,  Correct Procedure, Correct Position, site marked,  Risks and benefits discussed,  Pre-op evaluation,  At surgeon's request and post-op pain management  Laterality: Left and Right  Prep: Maximum Sterile Barrier Precautions used, chloraprep       Needles:  Injection technique: Single-shot  Needle Type: Echogenic Stimulator Needle     Needle Length: 9cm  Needle Gauge: 21     Additional Needles:   Procedures:,,,, ultrasound used (permanent image in chart),,    Narrative:  Start time: 01/11/2023 7:47 AM End time: 01/11/2023 7:57 AM Injection made incrementally with aspirations every 5 mL.  Performed by: Personally  Anesthesiologist: Gaynelle Adu, MD

## 2023-01-11 NOTE — Anesthesia Procedure Notes (Signed)
Procedure Name: Intubation Date/Time: 01/11/2023 8:42 AM  Performed by: Orlin Hilding, CRNAPre-anesthesia Checklist: Patient identified, Emergency Drugs available, Suction available, Patient being monitored and Timeout performed Patient Re-evaluated:Patient Re-evaluated prior to induction Oxygen Delivery Method: Circle system utilized Preoxygenation: Pre-oxygenation with 100% oxygen Induction Type: IV induction Ventilation: Mask ventilation without difficulty Laryngoscope Size: Mac and 3 Grade View: Grade I Tube type: Oral Tube size: 7.0 mm Number of attempts: 1 Placement Confirmation: ETT inserted through vocal cords under direct vision, positive ETCO2 and breath sounds checked- equal and bilateral Secured at: 23 cm Tube secured with: Tape Dental Injury: Teeth and Oropharynx as per pre-operative assessment

## 2023-01-11 NOTE — Interval H&P Note (Signed)
History and Physical Interval Note:  01/11/2023 8:16 AM  Amy Santana  has presented today for surgery, with the diagnosis of symptomatic fibroids.  The various methods of treatment have been discussed with the patient and family. After consideration of risks, benefits and other options for treatment, the patient has consented to  Procedure(s): ABDOMINAL MYOMECTOMY (N/A) as a surgical intervention.  The patient's history has been reviewed, patient examined, no change in status, stable for surgery.  I have reviewed the patient's chart and labs.  Questions were answered to the patient's satisfaction.     Leighton Roach Davielle Lingelbach

## 2023-01-11 NOTE — Transfer of Care (Signed)
Immediate Anesthesia Transfer of Care Note  Patient: Amy Santana  Procedure(s) Performed: ABDOMINAL MYOMECTOMY  Patient Location: PACU  Anesthesia Type:GA combined with regional for post-op pain  Level of Consciousness: drowsy  Airway & Oxygen Therapy: Patient Spontanous Breathing and Patient connected to nasal cannula oxygen  Post-op Assessment: Report given to RN and Post -op Vital signs reviewed and stable  Post vital signs: Reviewed and stable  Last Vitals:  Vitals Value Taken Time  BP 143/92 01/11/23 1119  Temp    Pulse 82 01/11/23 1123  Resp 21 01/11/23 1123  SpO2 95 % 01/11/23 1123  Vitals shown include unvalidated device data.  Last Pain:  Vitals:   01/11/23 0656  TempSrc:   PainSc: 0-No pain      Patients Stated Pain Goal: 0 (01/11/23 0656)  Complications: No notable events documented.

## 2023-01-11 NOTE — Anesthesia Postprocedure Evaluation (Signed)
Anesthesia Post Note  Patient: Amy Santana  Procedure(s) Performed: ABDOMINAL MYOMECTOMY     Patient location during evaluation: PACU Anesthesia Type: General and Regional Level of consciousness: awake and alert Pain management: pain level controlled Vital Signs Assessment: post-procedure vital signs reviewed and stable Respiratory status: spontaneous breathing, nonlabored ventilation and respiratory function stable Cardiovascular status: blood pressure returned to baseline and stable Postop Assessment: no apparent nausea or vomiting Anesthetic complications: no  No notable events documented.  Last Vitals:  Vitals:   01/11/23 1215 01/11/23 1230  BP: (!) 149/101 (!) 131/90  Pulse: 81 75  Resp: 19 12  Temp:    SpO2: 100% 98%    Last Pain:  Vitals:   01/11/23 1230  TempSrc:   PainSc: Asleep                 Rusti Arizmendi,W. EDMOND

## 2023-01-11 NOTE — Progress Notes (Addendum)
Pt was bleeding from her incision site. RN changed the dressing with honeycomb, ABD and tape just like the previous dressing. MD was informed. MD changed the dressing again and ordered Tranexamic acid infusion and repeat CBC tomorrow. Ice applied in the area.

## 2023-01-11 NOTE — Plan of Care (Signed)
  Problem: Education: Goal: Knowledge of General Education information will improve Description: Including pain rating scale, medication(s)/side effects and non-pharmacologic comfort measures Outcome: Progressing   Problem: Health Behavior/Discharge Planning: Goal: Ability to manage health-related needs will improve Outcome: Progressing   Problem: Clinical Measurements: Goal: Ability to maintain clinical measurements within normal limits will improve Outcome: Progressing Goal: Will remain free from infection Outcome: Progressing Goal: Diagnostic test results will improve Outcome: Progressing Goal: Respiratory complications will improve Outcome: Progressing Goal: Cardiovascular complication will be avoided Outcome: Progressing   Problem: Activity: Goal: Risk for activity intolerance will decrease Outcome: Progressing   Problem: Nutrition: Goal: Adequate nutrition will be maintained Outcome: Progressing   Problem: Coping: Goal: Level of anxiety will decrease Outcome: Progressing   Problem: Elimination: Goal: Will not experience complications related to bowel motility Outcome: Progressing Goal: Will not experience complications related to urinary retention Outcome: Progressing   Problem: Pain Managment: Goal: General experience of comfort will improve Outcome: Progressing   Problem: Safety: Goal: Ability to remain free from injury will improve Outcome: Progressing   Problem: Skin Integrity: Goal: Risk for impaired skin integrity will decrease Outcome: Progressing   Problem: Education: Goal: Knowledge of the prescribed therapeutic regimen will improve Outcome: Progressing Goal: Understanding of sexual limitations or changes related to disease process or condition will improve Outcome: Progressing Goal: Individualized Educational Video(s) Outcome: Progressing   Problem: Self-Concept: Goal: Communication of feelings regarding changes in body function or  appearance will improve Outcome: Progressing   Problem: Skin Integrity: Goal: Demonstration of wound healing without infection will improve Outcome: Progressing   

## 2023-01-12 ENCOUNTER — Encounter (HOSPITAL_COMMUNITY): Payer: Self-pay | Admitting: Obstetrics and Gynecology

## 2023-01-12 LAB — CBC
HCT: 28 % — ABNORMAL LOW (ref 36.0–46.0)
Hemoglobin: 9.2 g/dL — ABNORMAL LOW (ref 12.0–15.0)
MCH: 30 pg (ref 26.0–34.0)
MCHC: 32.9 g/dL (ref 30.0–36.0)
MCV: 91.2 fL (ref 80.0–100.0)
Platelets: 148 10*3/uL — ABNORMAL LOW (ref 150–400)
RBC: 3.07 MIL/uL — ABNORMAL LOW (ref 3.87–5.11)
RDW: 14.4 % (ref 11.5–15.5)
WBC: 9.6 10*3/uL (ref 4.0–10.5)
nRBC: 0 % (ref 0.0–0.2)

## 2023-01-12 LAB — SURGICAL PATHOLOGY

## 2023-01-12 NOTE — TOC CM/SW Note (Signed)
  Transition of Care (TOC) Screening Note   Patient Details  Name: Amy Santana Date of Birth: [birth year]      Transition of Care Department [facility]) has reviewed patient and no TOC needs have been identified at this time. We will continue to monitor patient advancement through interdisciplinary progression rounds. If new patient transition needs arise, please place a TOC consult.

## 2023-01-12 NOTE — Progress Notes (Signed)
1 Day Post-Op Procedure(s) (LRB): ABDOMINAL MYOMECTOMY (N/A)  Subjective: Patient reports incisional pain, tolerating PO, and no problems voiding.    Objective: I have reviewed patient's vital signs, medications, and labs.  General: alert GI: soft, mild/apprropriate tenderness, pressure bandage in place and dry  CBC    Component Value Date/Time   WBC 9.6 01/12/2023 0114   RBC 3.07 (L) 01/12/2023 0114   HGB 9.2 (L) 01/12/2023 0114   HCT 28.0 (L) 01/12/2023 0114   PLT 148 (L) 01/12/2023 0114   MCV 91.2 01/12/2023 0114   MCV 88.7 02/29/2016 1601   MCH 30.0 01/12/2023 0114   MCHC 32.9 01/12/2023 0114   RDW 14.4 01/12/2023 0114   LYMPHSABS 0.3 (L) 01/11/2023 1814   MONOABS 0.2 01/11/2023 1814   EOSABS 0.0 01/11/2023 1814   BASOSABS 0.0 01/11/2023 1814     Assessment: s/p Procedure(s): ABDOMINAL MYOMECTOMY (N/A): stable and progressing well, bleeding from incision appears to be controlled currently, hemoglobin with mild/appropriate drop  Plan: Advance diet Encourage ambulation Monitor for further bleeding from incision  LOS: 1 day    Zenaida Niece, MD 01/12/2023, 7:49 AM

## 2023-01-12 NOTE — Progress Notes (Signed)
Doing well, walked around floor, tol PO, voiding Afeb, VSS Abd- soft, same dressing as this am is C/D/I Continue routine care overnight, ambulate, plan discharge in am

## 2023-01-12 NOTE — Progress Notes (Signed)
Patient is resting comfortably. Pain control is good, in fact she states she is having no pain.  Denies lightheadedness or other complaints.  Vitals:   01/11/23 1343 01/11/23 1623 01/11/23 2005 01/12/23 0036  BP: 139/85 (!) 144/90 124/79 110/75  Pulse: 74 78 77 82  Resp: Temp: 98.1 F (36.7 C) (!) 97.3 F (36.3 C) 98 F (36.7 C) 98.1 F (36.7 C)  TempSrc: Oral Oral Oral Oral  SpO2: 99% 100% 100% 100%  Weight:      Height:       Gen: NAD Abd: soft, nontender Inc: pressure dressing and remnant of honeycomb on right incision removed.  Gauze and ABD pad moderately saturated with estimated 150cc blood.  Incision intact but for 2 loose staples at left angle, removed and incision examined with sterile gloves and gauze.  Source of bleeding identified as small superficial capillary at lower aspect of incision.     Lab Results  Component Value Date   WBC 9.8 01/11/2023   HGB 10.1 (L) 01/11/2023   HCT 31.8 (L) 01/11/2023   MCV 92.7 01/11/2023   PLT 154 01/11/2023    --/--/B POS (04/11 1030)  A/P Postop day #1 s/p abdominal myomectomy approx 12 hrs ago. Hb 10.1 post operatively, s/p TXA and prior dressing change due to bleeding approximately 9pm. Vital signs normal range and patient appears well with no pain.  No sign of intraabdominal bleeding.  Superficial bleeding at lower aspect of left side of incision: Pressure applied for several minutes.  Minimal oozing noted.  Steri strips applied to reapproximate skin at left angle.  Folded sterile gauze applied directly over area of small superficial capillary and topped with fresh honeycomb dressing.  Additional ABD pad and tape used to apply additional pressure.    Philip Aspen

## 2023-01-13 MED ORDER — IBUPROFEN 600 MG PO TABS: 600.0000 mg | ORAL_TABLET | Freq: Four times a day (QID) | ORAL | 0 refills | Status: DC

## 2023-01-13 MED ORDER — OXYCODONE HCL 5 MG PO TABS: 5.0000 mg | ORAL_TABLET | ORAL | 0 refills | Status: DC | PRN

## 2023-01-13 NOTE — Discharge Instructions (Signed)
Routine instructions for abdominal myomectomy 

## 2023-01-13 NOTE — Progress Notes (Signed)
POD #2 myomectomy  Doing well, pain tolerable, voiding, ambulating, tol PO Afeb, VSS Abd- soft, pressure bandage removed-no bleeding  D/c today, place new honeycomb dressing

## 2023-01-15 NOTE — Discharge Summary (Signed)
Physician Discharge Summary  Patient ID: Amy Santana MRN: [id number] DOB/AGE: 05/22/1971 52 y.o.  Admit date: 01/11/2023 Discharge date: 01/15/2023  Admission Diagnoses:  Symptomatic myomatous uterus  Discharge Diagnoses:  Symptomatic myomatous uterus Principal Problem:   Uterine leiomyoma   Discharged Condition: good  Hospital Course: She was admitted and underwent abdominal myomectomy under general anesthesia with bilateral TAP block, EBL 400cc, removed 4 large and 4 small myomas.  She did well postop except for some pesky bleeding from her incision.  Good pain control and met post-op milestones otherwise, stable for discharge on POD #2  Discharge Exam: Blood pressure 119/67, pulse 85, temperature 97.6 F (36.4 C), temperature source Oral, resp. rate 17, height  (1.549 m), weight 78.9 kg, last menstrual period 01/10/2023, SpO2 99 %. General appearance: alert  Disposition: Discharge disposition: 01-Home or Self Care       Discharge Instructions     Call MD for:  persistant nausea and vomiting   Complete by: As directed    Call MD for:  redness, tenderness, or signs of infection (pain, swelling, redness, odor or green/yellow discharge around incision site)   Complete by: As directed    Call MD for:  severe uncontrolled pain   Complete by: As directed    Call MD for:  temperature >100.4   Complete by: As directed    Diet - low sodium heart healthy   Complete by: As directed    Increase activity slowly   Complete by: As directed    Lifting restrictions   Complete by: As directed    10 lbs      Allergies as of 01/13/2023   No Known Allergies      Medication List     STOP taking these medications    diclofenac 75 MG EC tablet Commonly known as: VOLTAREN   famotidine 20 MG tablet Commonly known as: PEPCID   sucralfate 1 g tablet Commonly known as: Carafate       TAKE these medications    acetaminophen 325 MG tablet Commonly known as:  TYLENOL Take 325 mg by mouth every 6 (six) hours as needed for moderate pain.   B-12 PO Take 1 tablet by mouth daily. 1000 mcg   ferrous sulfate 325 (65 FE) MG tablet Take 325 mg by mouth daily.   fluticasone 50 MCG/ACT nasal spray Commonly known as: FLONASE Place 1 spray into both nostrils daily as needed for allergies or rhinitis.   FOLIC ACID PO Take 1 tablet by mouth daily.   ibuprofen 600 MG tablet Commonly known as: ADVIL Take 1 tablet (600 mg total) by mouth every 6 (six) hours.   loratadine 10 MG tablet Commonly known as: CLARITIN Take 10 mg by mouth daily as needed for allergies.   Melatonin 5 MG Caps Take 5 mg by mouth at bedtime as needed (sleep).   oxyCODONE 5 MG immediate release tablet Commonly known as: Oxy IR/ROXICODONE Take 1 tablet (5 mg total) by mouth every 4 (four) hours as needed for severe pain.        Follow-up Information     Reesha Debes, Tawanna Cooler, MD. Schedule an appointment as soon as possible for a visit.   Specialty: Obstetrics and Gynecology Why: for staple removal Contact information: [address] [place] [address] [phone]                 Signed: Leighton Roach Renesha Lizama 01/15/2023, 5:18 PM

## 2023-01-17 ENCOUNTER — Telehealth: Payer: Self-pay

## 2023-01-17 NOTE — Transitions of Care (Post Inpatient/ED Visit) (Signed)
   01/17/2023  Name: Amy Santana MRN: [id number] DOB: May 20, 1971  Today's TOC FU Call Status: Today's TOC FU Call Status:: Unsuccessul Call (1st Attempt) Unsuccessful Call (1st Attempt) Date: 01/17/23  Attempted to reach the patient regarding the most recent Inpatient/ED visit.  Follow Up Plan: Additional outreach attempts will be made to reach the patient to complete the Transitions of Care (Post Inpatient/ED visit) call.   Signature Kandis Fantasia, LPN [facility] Advisor [place] [facility] [employer] Are. We Are. [employer] # [phone]

## 2023-01-17 NOTE — Telephone Encounter (Signed)
Patient returned Courtney's call. Requests to be called.

## 2023-04-19 ENCOUNTER — Encounter: Payer: Self-pay | Admitting: Family Medicine

## 2023-04-19 ENCOUNTER — Telehealth: Payer: Self-pay | Admitting: Family Medicine

## 2023-04-19 NOTE — Telephone Encounter (Signed)
Left VM message to call us back for CPE appt.

## 2023-05-31 ENCOUNTER — Encounter: Payer: BC Managed Care – PPO | Admitting: Family Medicine

## 2023-06-07 ENCOUNTER — Encounter: Payer: BC Managed Care – PPO | Admitting: Family Medicine

## 2023-08-01 ENCOUNTER — Encounter: Payer: Self-pay | Admitting: Family Medicine

## 2023-08-01 ENCOUNTER — Ambulatory Visit (INDEPENDENT_AMBULATORY_CARE_PROVIDER_SITE_OTHER): Payer: BC Managed Care – PPO | Admitting: Family Medicine

## 2023-08-01 VITALS — BP 115/73 | HR 76 | Temp 98.0°F | Ht 61.0 in | Wt 179.0 lb

## 2023-08-01 DIAGNOSIS — Z0000 Encounter for general adult medical examination without abnormal findings: Secondary | ICD-10-CM | POA: Diagnosis not present

## 2023-08-01 DIAGNOSIS — Z23 Encounter for immunization: Secondary | ICD-10-CM

## 2023-08-01 DIAGNOSIS — Z13220 Encounter for screening for lipoid disorders: Secondary | ICD-10-CM | POA: Diagnosis not present

## 2023-08-01 DIAGNOSIS — R7303 Prediabetes: Secondary | ICD-10-CM

## 2023-08-01 LAB — LIPID PANEL
Cholesterol: 188 mg/dL (ref 0–200)
HDL: 53.3 mg/dL (ref >39.00)
LDL Cholesterol: 121 mg/dL — ABNORMAL HIGH (ref 0–99)
NonHDL: 134.81
Total CHOL/HDL Ratio: 4
Triglycerides: 70 mg/dL (ref 0.0–149.0)
VLDL: 14 mg/dL (ref 0.0–40.0)

## 2023-08-01 LAB — CBC
HCT: 39.7 % (ref 36.0–46.0)
Hemoglobin: 12.7 g/dL (ref 12.0–15.0)
MCHC: 32 g/dL (ref 30.0–36.0)
MCV: 90.5 fL (ref 78.0–100.0)
Platelets: 207 10*3/uL (ref 150.0–400.0)
RBC: 4.38 Mil/uL (ref 3.87–5.11)
RDW: 14.6 % (ref 11.5–15.5)
WBC: 4.3 10*3/uL (ref 4.0–10.5)

## 2023-08-01 LAB — COMPREHENSIVE METABOLIC PANEL
ALT: 18 U/L (ref 0–35)
AST: 19 U/L (ref 0–37)
Albumin: 4.2 g/dL (ref 3.5–5.2)
Alkaline Phosphatase: 59 U/L (ref 39–117)
BUN: 9 mg/dL (ref 6–23)
CO2: 29 meq/L (ref 19–32)
Calcium: 9.2 mg/dL (ref 8.4–10.5)
Chloride: 102 meq/L (ref 96–112)
Creatinine, Ser: 0.79 mg/dL (ref 0.40–1.20)
GFR: 86.6 mL/min (ref >60.00)
Glucose, Bld: 81 mg/dL (ref 70–99)
Potassium: 3.5 meq/L (ref 3.5–5.1)
Sodium: 139 meq/L (ref 135–145)
Total Bilirubin: 0.5 mg/dL (ref 0.2–1.2)
Total Protein: 7.4 g/dL (ref 6.0–8.3)

## 2023-08-01 LAB — TSH: TSH: 1.81 u[IU]/mL (ref 0.35–5.50)

## 2023-08-01 LAB — HEMOGLOBIN A1C: Hgb A1c MFr Bld: 5.8 % (ref 4.6–6.5)

## 2023-08-01 NOTE — Progress Notes (Signed)
Phone [phone]   Subjective:   Patient is a 52 y.o. female presenting for annual physical.    Chief Complaint  Patient presents with   Annual Exam    Fasting today     Annual - Has been exercising. Per pt, her real age is 52 y/o - age difference due to medical records in [place].   PreDM - Working on healthy diet and exercise. Signed up at the [facility], plans for her whole family to start going.   S/p Myomectomy in 12/2022 - States her surgery went well. She has been recovering well. Reports her largest fibroid was 10 lbs.   Ear pain - Complains of right ear pain starting about 1 week ago.   Pt is interested in pregnancy. Is taking prenatal vitamins.    See problem oriented charting- ROS- ROS: Gen: no fever, chills  Skin: no rash, itching ENT: no ear drainage, nasal congestion, rhinorrhea, sinus pressure, sore throat Eyes: no blurry vision, double vision Resp: no cough, wheeze,SOB CV: no CP, palpitations, LE edema,  GI: no heartburn, n/v/d/c, abd pain GU: no dysuria, urgency, frequency, hematuria MSK: no joint pain, myalgias, back pain Neuro: no dizziness, headache, weakness, vertigo Psych: no depression, anxiety, insomnia, SI   The following were reviewed and entered/updated in epic: Past Medical History:  Diagnosis Date   Uterine fibroid    Patient Active Problem List   Diagnosis Date Noted   Prediabetes 11/30/2021   Uterine leiomyoma 11/30/2021   Seasonal allergic rhinitis 01/17/2013   GERD without esophagitis 01/17/2013   Nasal polyp 01/04/2013   Past Surgical History:  Procedure Laterality Date   MYOMECTOMY N/A 01/11/2023   Procedure: ABDOMINAL MYOMECTOMY;  Surgeon: Lavina Hamman, MD;  Location: [facility] OR;  Service: Gynecology;  Laterality: N/A;   NASAL POLYP EXCISION     WISDOM TOOTH EXTRACTION      Family History  Problem Relation Age of Onset   Hypertension Mother     Medications- reviewed and updated Current Outpatient Medications  Medication Sig  Dispense Refill   Cyanocobalamin (B-12 PO) Take 1 tablet by mouth daily. 1000 mcg     ferrous sulfate 325 (65 FE) MG tablet Take 325 mg by mouth daily.     FOLIC ACID PO Take 1 tablet by mouth daily.     loratadine (CLARITIN) 10 MG tablet Take 10 mg by mouth daily as needed for allergies.     Melatonin 5 MG CAPS Take 5 mg by mouth at bedtime as needed (sleep).     Prenatal Vit-Fe Fumarate-FA (MULTIVITAMIN-PRENATAL) 27-0.8 MG TABS tablet Take 1 tablet by mouth daily at 12 noon.     No current facility-administered medications for this visit.    Allergies-reviewed and updated No Known Allergies  Social History   Social History Narrative   From [place].   Came to the [place] at age 89.   Lives with her mother and her 3 children.      [employer].   Objective  Objective:  BP 115/73   Pulse 76   Temp 98 F (36.7 C) (Temporal)   Ht 5\' 1"  (1.549 m)   Wt 179 lb (81.2 kg)   LMP 07/10/2023   SpO2 100%   BMI 33.82 kg/m  Physical Exam  Gen: WDWN NAD HEENT: NCAT, conjunctiva not injected, sclera nonicteric TM WNL B, OP moist, no exudates. NECK:  supple, no thyromegaly, no nodes, no carotid bruits CARDIAC: RRR, S1S2+, no murmur. DP 2+B LUNGS: CTAB. No wheezes ABDOMEN:  BS+, soft,  NTND, No HSM, no masses EXT:  no edema MSK: no gross abnormalities. MS 5/5 all 4 NEURO: A&O x3.  CN II-XII intact.  PSYCH: normal mood. Good eye contact     Assessment and Plan   Health Maintenance counseling: 1. Anticipatory guidance: Patient counseled regarding regular dental exams q6 months, eye exams,  avoiding smoking and second hand smoke, limiting alcohol to 1 beverage per day, no illicit drugs.   2. Risk factor reduction:  Advised patient of need for regular exercise and diet rich and fruits and vegetables to reduce risk of heart attack and stroke. Exercise- Has been exercising.  Wt Readings from Last 3 Encounters:  08/01/23 179 lb (81.2 kg)  01/11/23 174 lb (78.9 kg)  01/05/23 174 lb  (78.9 kg)   3. Immunizations/screenings/ancillary studies Immunization History  Administered Date(s) Administered   Influenza, Seasonal, Injecte, Preservative Fre 08/01/2023   Influenza,inj,Quad PF,6+ Mos 07/10/2013   Influenza-Unspecified 06/26/2015, 06/26/2017   MMR 06/11/2009, 07/09/2009   PFIZER(Purple Top)SARS-COV-2 Vaccination 12/06/2019, 12/27/2019, 09/01/2020   Td (Adult),5 Lf Tetanus Toxid, Preservative Free 07/09/2009, 01/21/2010   Tdap 06/11/2009, 07/10/2013, 08/01/2023   Health Maintenance Due  Topic Date Due   Cervical Cancer Screening (HPV/Pap Cotest)  02/07/2021   MAMMOGRAM  02/22/2022    4. Cervical cancer screening- Is UTD, last done before her surgery in 12/2022, per pt.  5. Breast cancer screening-  mammogram UTD, last done prior to her surgery in 12/2022, per pt.  6. Colon cancer screening - Pt denies colonoscopy, wants to first get pregnant before getting a colonoscopy.  7. Skin cancer screening- advised regular sunscreen use. Denies worrisome, changing, or new skin lesions.  8. Birth control/STD check- trying for pregnancy.  9. Osteoporosis screening- N/a 10. Smoking associated screening - non smoker  General medical exam -     CBC -     Comprehensive metabolic panel -     Hemoglobin A1c -     TSH -     Lipid panel  Prediabetes -     Hemoglobin A1c  Flu vaccine need -     Flu vaccine trivalent PF, 6mos and older(Flulaval,Afluria,Fluarix,Fluzone)  Need for diphtheria-tetanus-pertussis (Tdap) vaccine -     Tdap vaccine greater than or equal to 7yo IM   Wellness-anticipatory guidance.  Work on Diet/Exercise  Check CBC,CMP,lipids,TSH, A1C.  F/u 1 yr  Tdap given  pt wants to defer colon.  Will request copy mamm/pap PreDM-working on diet/exercise  Recommended follow up: Return in about 1 year (around 07/31/2024) for annual physical.  Lab/Order associations: + fasting    I, Isabelle Course, acting as a scribe for Angelena Sole, MD., have documented all  relevant documentation on the behalf of Angelena Sole, MD, as directed by  Angelena Sole, MD while in the presence of Angelena Sole, MD.  I, Angelena Sole, MD, have reviewed all documentation for this visit. The documentation on 08/01/23 for the exam, diagnosis, procedures, and orders are all accurate and complete.  Angelena Sole, MD

## 2023-08-01 NOTE — Progress Notes (Signed)
Mostly labs look good/at goal. 1.  Hemoglobin is much improved.  She can stop the extra iron and just stay on prenatal vitamins 2.  A1C(3 month average of sugars) is elevated.  This is considered PreDiabetes.  Work on diet-decrease sugars and starches and aim for 30 minutes of exercise 5 days/week to prevent progression to diabetes

## 2023-08-01 NOTE — Patient Instructions (Signed)

## 2024-08-01 ENCOUNTER — Encounter: Payer: BC Managed Care – PPO | Admitting: Family Medicine
